# Patient Record
Sex: Male | Born: 1940 | Race: Black or African American | Hispanic: No | Marital: Married | Smoking: Former smoker
Health system: Southern US, Community
[De-identification: ages and names within clinical notes are randomized; demographics above are authoritative.]

## PROBLEM LIST (undated history)

## (undated) DIAGNOSIS — L309 Dermatitis, unspecified: Secondary | ICD-10-CM

## (undated) DIAGNOSIS — I255 Ischemic cardiomyopathy: Secondary | ICD-10-CM

## (undated) DIAGNOSIS — I2510 Atherosclerotic heart disease of native coronary artery without angina pectoris: Secondary | ICD-10-CM

## (undated) DIAGNOSIS — R739 Hyperglycemia, unspecified: Secondary | ICD-10-CM

## (undated) DIAGNOSIS — R42 Dizziness and giddiness: Secondary | ICD-10-CM

## (undated) DIAGNOSIS — I10 Essential (primary) hypertension: Secondary | ICD-10-CM

## (undated) DIAGNOSIS — C61 Malignant neoplasm of prostate: Secondary | ICD-10-CM

## (undated) DIAGNOSIS — D649 Anemia, unspecified: Secondary | ICD-10-CM

## (undated) DIAGNOSIS — E785 Hyperlipidemia, unspecified: Secondary | ICD-10-CM

## (undated) DIAGNOSIS — M1990 Unspecified osteoarthritis, unspecified site: Secondary | ICD-10-CM

## (undated) DIAGNOSIS — K219 Gastro-esophageal reflux disease without esophagitis: Secondary | ICD-10-CM

## (undated) DIAGNOSIS — I219 Acute myocardial infarction, unspecified: Secondary | ICD-10-CM

## (undated) HISTORY — DX: Anemia, unspecified: D64.9

## (undated) HISTORY — DX: Unspecified osteoarthritis, unspecified site: M19.90

## (undated) HISTORY — DX: Dermatitis, unspecified: L30.9

## (undated) HISTORY — DX: Dizziness and giddiness: R42

## (undated) HISTORY — PX: CARDIAC SURGERY: SHX584

## (undated) HISTORY — DX: Ischemic cardiomyopathy: I25.5

## (undated) HISTORY — DX: Hyperglycemia, unspecified: R73.9

## (undated) HISTORY — DX: Atherosclerotic heart disease of native coronary artery without angina pectoris: I25.10

## (undated) HISTORY — DX: Essential (primary) hypertension: I10

## (undated) HISTORY — DX: Hyperlipidemia, unspecified: E78.5

## (undated) HISTORY — DX: Gastro-esophageal reflux disease without esophagitis: K21.9

## (undated) HISTORY — DX: Acute myocardial infarction, unspecified: I21.9

## (undated) HISTORY — PX: CORONARY STENT PLACEMENT: SHX1402

---

## 2005-04-18 ENCOUNTER — Emergency Department: Payer: Self-pay | Admitting: Emergency Medicine

## 2005-04-19 ENCOUNTER — Other Ambulatory Visit: Payer: Self-pay

## 2008-03-25 ENCOUNTER — Ambulatory Visit: Payer: Self-pay | Admitting: Family Medicine

## 2008-03-25 ENCOUNTER — Emergency Department: Payer: Self-pay | Admitting: Emergency Medicine

## 2008-04-02 ENCOUNTER — Inpatient Hospital Stay: Payer: Self-pay | Admitting: Internal Medicine

## 2008-04-12 ENCOUNTER — Ambulatory Visit: Payer: Self-pay | Admitting: Family Medicine

## 2013-03-21 ENCOUNTER — Other Ambulatory Visit: Payer: Self-pay | Admitting: Family Medicine

## 2013-03-21 LAB — COMPREHENSIVE METABOLIC PANEL
ALBUMIN: 3.6 g/dL (ref 3.4–5.0)
ALT: 18 U/L (ref 12–78)
AST: 20 U/L (ref 15–37)
Alkaline Phosphatase: 83 U/L
Anion Gap: 3 — ABNORMAL LOW (ref 7–16)
BILIRUBIN TOTAL: 0.5 mg/dL (ref 0.2–1.0)
BUN: 13 mg/dL (ref 7–18)
CHLORIDE: 105 mmol/L (ref 98–107)
CO2: 29 mmol/L (ref 21–32)
Calcium, Total: 9.3 mg/dL (ref 8.5–10.1)
Creatinine: 0.98 mg/dL (ref 0.60–1.30)
EGFR (Non-African Amer.): >60
GLUCOSE: 93 mg/dL (ref 65–99)
Osmolality: 274 (ref 275–301)
Potassium: 3.6 mmol/L (ref 3.5–5.1)
Sodium: 137 mmol/L (ref 136–145)
TOTAL PROTEIN: 8.6 g/dL — AB (ref 6.4–8.2)

## 2013-03-21 LAB — CBC WITH DIFFERENTIAL/PLATELET
BASOS PCT: 0.9 %
Basophil #: 0.1 10*3/uL (ref 0.0–0.1)
EOS PCT: 5.3 %
Eosinophil #: 0.4 10*3/uL (ref 0.0–0.7)
HCT: 35.5 % — ABNORMAL LOW (ref 40.0–52.0)
HGB: 12 g/dL — ABNORMAL LOW (ref 13.0–18.0)
Lymphocyte #: 1.6 10*3/uL (ref 1.0–3.6)
Lymphocyte %: 20.1 %
MCH: 27.8 pg (ref 26.0–34.0)
MCHC: 33.8 g/dL (ref 32.0–36.0)
MCV: 82 fL (ref 80–100)
MONOS PCT: 9.9 %
Monocyte #: 0.8 x10 3/mm (ref 0.2–1.0)
NEUTROS ABS: 5.1 10*3/uL (ref 1.4–6.5)
NEUTROS PCT: 63.8 %
Platelet: 302 10*3/uL (ref 150–440)
RBC: 4.32 10*6/uL — AB (ref 4.40–5.90)
RDW: 16.2 % — ABNORMAL HIGH (ref 11.5–14.5)
WBC: 7.9 10*3/uL (ref 3.8–10.6)

## 2013-03-22 ENCOUNTER — Ambulatory Visit: Payer: Self-pay | Admitting: Family Medicine

## 2013-03-29 ENCOUNTER — Ambulatory Visit: Payer: Self-pay | Admitting: Family Medicine

## 2014-06-25 DIAGNOSIS — I2510 Atherosclerotic heart disease of native coronary artery without angina pectoris: Secondary | ICD-10-CM | POA: Insufficient documentation

## 2014-06-25 DIAGNOSIS — E782 Mixed hyperlipidemia: Secondary | ICD-10-CM | POA: Insufficient documentation

## 2014-06-25 DIAGNOSIS — I10 Essential (primary) hypertension: Secondary | ICD-10-CM

## 2014-06-25 DIAGNOSIS — R0681 Apnea, not elsewhere classified: Secondary | ICD-10-CM | POA: Insufficient documentation

## 2014-06-25 HISTORY — DX: Essential (primary) hypertension: I10

## 2014-06-25 HISTORY — DX: Atherosclerotic heart disease of native coronary artery without angina pectoris: I25.10

## 2014-07-23 DIAGNOSIS — I255 Ischemic cardiomyopathy: Secondary | ICD-10-CM | POA: Insufficient documentation

## 2014-07-23 HISTORY — DX: Ischemic cardiomyopathy: I25.5

## 2014-08-22 ENCOUNTER — Telehealth: Payer: Self-pay

## 2014-08-22 NOTE — Telephone Encounter (Signed)
-----   Message from Otilio Jefferson sent at 08/19/2014 11:04 AM EDT ----- Regarding: Re: triage From: [place] Sent: 08/05/2014  Pt going for a stress test May 10th. Would like a call back after that to schedule. GF  > From: U[place]ses [place] > To: Colonoscopy, Triage > Sent: 06/19/2014 2:02 PM > please contact for colonoscopy screening

## 2014-08-22 NOTE — Telephone Encounter (Signed)
Contacted pt to schedule colonoscopy. While going through medication list. Pt is currently taking Plavix and a daily ASA. Notified him he will need to schedule an appt with Vickey Huger. Pt scheduled for an appt on July 1st.

## 2014-09-10 ENCOUNTER — Telehealth: Payer: Self-pay

## 2014-09-10 ENCOUNTER — Other Ambulatory Visit: Payer: Self-pay

## 2014-09-10 NOTE — Telephone Encounter (Signed)
Upon getting charts ready for clinic, noticed patient is not currently taking Plavix per PCP. Called Dr. Alveria Apley office and spoke with Baker Janus. She verifies that patient has not been taking Plavix since 2010 and is only on 81mg  ASA at this time.  Called patient to cancel appointment. No answer and unable to leave VM as mail is full at this time. Will call once again at a later time.

## 2014-09-12 ENCOUNTER — Telehealth: Payer: Self-pay | Admitting: Urgent Care

## 2014-09-12 NOTE — Telephone Encounter (Signed)
Pt has called and was advised that a nurse will contact him within 2 weeks time to do a triage over the phone for a colon cancer screening. Pt understood.

## 2014-09-13 ENCOUNTER — Ambulatory Visit: Payer: Self-pay | Admitting: Urgent Care

## 2014-09-14 ENCOUNTER — Emergency Department
Admission: EM | Admit: 2014-09-14 | Discharge: 2014-09-14 | Disposition: A | Discharge status: Home / Self Care | Payer: Medicare PPO | Attending: Emergency Medicine | Admitting: Emergency Medicine

## 2014-09-14 ENCOUNTER — Emergency Department: Payer: Medicare PPO

## 2014-09-14 DIAGNOSIS — E785 Hyperlipidemia, unspecified: Secondary | ICD-10-CM | POA: Diagnosis not present

## 2014-09-14 DIAGNOSIS — Z87891 Personal history of nicotine dependence: Secondary | ICD-10-CM | POA: Insufficient documentation

## 2014-09-14 DIAGNOSIS — Z79899 Other long term (current) drug therapy: Secondary | ICD-10-CM | POA: Insufficient documentation

## 2014-09-14 DIAGNOSIS — M1711 Unilateral primary osteoarthritis, right knee: Secondary | ICD-10-CM | POA: Diagnosis not present

## 2014-09-14 DIAGNOSIS — I10 Essential (primary) hypertension: Secondary | ICD-10-CM | POA: Insufficient documentation

## 2014-09-14 DIAGNOSIS — E119 Type 2 diabetes mellitus without complications: Secondary | ICD-10-CM | POA: Diagnosis not present

## 2014-09-14 DIAGNOSIS — Z7982 Long term (current) use of aspirin: Secondary | ICD-10-CM | POA: Insufficient documentation

## 2014-09-14 DIAGNOSIS — M25561 Pain in right knee: Secondary | ICD-10-CM | POA: Diagnosis present

## 2014-09-14 MED ORDER — TRAMADOL HCL 50 MG PO TABS
50.0000 mg | ORAL_TABLET | Freq: Once | ORAL | Status: AC
  Administered 2014-09-14: 50 mg via ORAL

## 2014-09-14 MED ORDER — TRAMADOL HCL 50 MG PO TABS
ORAL_TABLET | ORAL | Status: AC
  Administered 2014-09-14: 50 mg via ORAL
  Filled 2014-09-14: qty 1

## 2014-09-14 MED ORDER — TRAMADOL HCL 50 MG PO TABS: 50.0000 mg | ORAL_TABLET | Freq: Four times a day (QID) | ORAL | Status: DC | PRN

## 2014-09-14 NOTE — Discharge Instructions (Signed)
Arthritis, Nonspecific °Arthritis is pain, redness, warmth, or puffiness (inflammation) of a joint. The joint may be stiff or hurt when you move it. One or more joints may be affected. There are many types of arthritis. Your doctor may not know what type you have right away. The most common cause of arthritis is wear and tear on the joint (osteoarthritis). °HOME CARE  °· Only take medicine as told by your doctor. °· Rest the joint as much as possible. °· Raise (elevate) your joint if it is puffy. °· Use crutches if the painful joint is in your leg. °· Drink enough fluids to keep your pee (urine) clear or pale yellow. °· Follow your doctor's diet instructions. °· Use cold packs for very bad joint pain for 10 to 15 minutes every hour. Ask your doctor if it is okay for you to use hot packs. °· Exercise as told by your doctor. °· Take a warm shower if you have stiffness in the morning. °· Move your sore joints throughout the day. °GET HELP RIGHT AWAY IF:  °· You have a fever. °· You have very bad joint pain, puffiness, or redness. °· You have many joints that are painful and puffy. °· You are not getting better with treatment. °· You have very bad back pain or leg weakness. °· You cannot control when you poop (bowel movement) or pee (urinate). °· You do not feel better in 24 hours or are getting worse. °· You are having side effects from your medicine. °MAKE SURE YOU:  °· Understand these instructions. °· Will watch your condition. °· Will get help right away if you are not doing well or get worse. °Document Released: 05/26/2009 Document Revised: 08/31/2011 Document Reviewed: 05/26/2009 °ExitCare® Patient Information ©2015 [employer]. This information is not intended to replace advice given to you by your health care provider. Make sure you discuss any questions you have with your health care provider. ° °

## 2014-09-14 NOTE — ED Notes (Signed)
NAD noted at time of D/C. Pt taken to the lobby via wheelchair at this time.  

## 2014-09-14 NOTE — ED Provider Notes (Signed)
[facility] Emergency Department Provider Note  ____________________________________________  Time seen: Approximately 2:32 PM  I have reviewed the triage vital signs and the nursing notes.   HISTORY  Chief Complaint Leg Pain    HPI Bradley Chaney is a 74 y.o. male complaining of right knee pain for 2-3 months. Patient say increase in the last 3 days. Patient denies any injury increased physical activity. Patient stated he also pain in the left knee but is not severe as the right knee. Patient is rating his pain as a 10 over 10. States the pain is increased with ambulation. Patient denies any loss of sensation or loss of strength to the lower extremity. No palliative measures taken since the pain worsened 3 days ago.   Past Medical History  Diagnosis Date  . Hyperglycemia   . Hypertension   . CAD (coronary artery disease)   . Hyperlipidemia   . Neck mass     Right side  . Hematochezia   . Anemia   . Dizziness   . Eczema   . Myocardial infarction     There are no active problems to display for this patient.   Past Surgical History  Procedure Laterality Date  . Coronary stent placement      RCA  . Cardiac surgery      MIX2, stents placed    Current Outpatient Rx  Name  Route  Sig  Dispense  Refill  . amLODipine (NORVASC) 10 MG tablet   Oral   Take 10 mg by mouth daily.         Marland Kitchen aspirin EC 81 MG tablet   Oral   Take 81 mg by mouth daily.         Marland Kitchen atorvastatin (LIPITOR) 40 MG tablet   Oral   Take 40 mg by mouth daily.         . hydrochlorothiazide (MICROZIDE) 12.5 MG capsule   Oral   Take 12.5 mg by mouth daily.         . mometasone (ELOCON) 0.1 % cream   Topical   Apply 1 application topically daily.         . traMADol (ULTRAM) 50 MG tablet   Oral   Take 1 tablet (50 mg total) by mouth every 6 (six) hours as needed.   20 tablet   0     Allergies Review of patient's allergies indicates no known  allergies.  Family History  Problem Relation Age of Onset  . Cancer Mother     Social History History  Substance Use Topics  . Smoking status: Former Smoker    Quit date: 09/10/1962  . Smokeless tobacco: Never Used  . Alcohol Use: No    Review of Systems Constitutional: No fever/chills Eyes: No visual changes. ENT: No sore throat. Cardiovascular: Denies chest pain. Respiratory: Denies shortness of breath. Gastrointestinal: No abdominal pain.  No nausea, no vomiting.  No diarrhea.  No constipation. Genitourinary: Negative for dysuria. Musculoskeletal: Right knee pain. Skin: Negative for rash. Neurological: Negative for headaches, focal weakness or numbness. Endocrine:Hypertension, hyperlipidemia and diabetes. 10-point ROS otherwise negative.  ____________________________________________   PHYSICAL EXAM:  VITAL SIGNS: ED Triage Vitals  Enc Vitals Group     BP 09/14/14 1304 158/121 mmHg     Pulse Rate 09/14/14 1304 107     Resp --      Temp 09/14/14 1304 98.9 F (37.2 C)     Temp Source 09/14/14 1304 Oral  SpO2 09/14/14 1304 99 %     Weight 09/14/14 1304 240 lb (108.863 kg)     Height 09/14/14 1304 5\' 10"  (1.778 m)     Head Cir --      Peak Flow --      Pain Score 09/14/14 1306 10     Pain Loc --      Pain Edu? --      Excl. in [place]? --     Constitutional: Alert and oriented. Well appearing and in no acute distress. Eyes: Conjunctivae are normal. PERRL. EOMI. Head: Atraumatic. Nose: No congestion/rhinnorhea. Mouth/Throat: Mucous membranes are moist.  Oropharynx non-erythematous. Neck: No stridor. No cervical spine tenderness to palpation Hematological/Lymphatic/Immunilogical: No cervical lymphadenopathy. Cardiovascular: Normal rate, regular rhythm. Grossly normal heart sounds.  Good peripheral circulation. Elevated BP will retake before patient's discharge. Respiratory: Normal respiratory effort.  No retractions. Lungs CTAB. Gastrointestinal: Soft and  nontender. No distention. No abdominal bruits. No CVA tenderness. Musculoskeletal: No obvious deformity to the lower extremities. Mild edema bilaterally. Anterior patella is tender palpation. Marked crepitus. Neurologic:  Normal speech and language. No gross focal neurologic deficits are appreciated. Speech is normal. No gait instability. Skin:  Skin is warm, dry and intact. No rash noted. Psychiatric: Mood and affect are normal. Speech and behavior are normal.  ____________________________________________   LABS (all labs ordered are listed, but only abnormal results are displayed)  Labs Reviewed - No data to display ____________________________________________  EKG   ____________________________________________  RADIOLOGY  Severe  arthritis of the medial compartment of the right knee. I, Sable Feil, personally viewed and evaluated these images as part of my medical decision making.   ____________________________________________   PROCEDURES  Procedure(s) performed: None  Critical Care performed: No  ____________________________________________   INITIAL IMPRESSION / ASSESSMENT AND PLAN / ED COURSE  Pertinent labs & imaging results that were available during my care of the patient were reviewed by me and considered in my medical decision making (see chart for details).  Severe arthritis right knee. Patient advised to follow with his family doctor for continued care at this diagnosis. Patient advised determined EF his condition worsens. Patient will be given tramadol for 3 days.Dicharge BP 144/67.  Further evaluation and treatment plans family doctor. FINAL CLINICAL IMPRESSION(S) / ED DIAGNOSES  Final diagnoses:  Primary osteoarthritis of right knee      Sable Feil, PA-C 09/14/14 [place], PA-C 09/14/14 1549  Lisa Roca, MD 09/21/14 2043

## 2014-09-14 NOTE — ED Notes (Signed)
Pt reporting right leg pain, denies any injury or falls.

## 2014-09-18 NOTE — Telephone Encounter (Signed)
LVM for pt to return my call.

## 2014-09-26 NOTE — Telephone Encounter (Signed)
Tried contacting pt to schedule colonoscopy. VM is full. Unable to leave message. Mailed letter.

## 2014-10-14 DIAGNOSIS — C61 Malignant neoplasm of prostate: Secondary | ICD-10-CM

## 2014-10-14 HISTORY — DX: Malignant neoplasm of prostate: C61

## 2014-10-17 DIAGNOSIS — D649 Anemia, unspecified: Secondary | ICD-10-CM | POA: Insufficient documentation

## 2014-10-17 DIAGNOSIS — L309 Dermatitis, unspecified: Secondary | ICD-10-CM | POA: Insufficient documentation

## 2014-10-17 DIAGNOSIS — R739 Hyperglycemia, unspecified: Secondary | ICD-10-CM | POA: Insufficient documentation

## 2014-10-17 HISTORY — DX: Dermatitis, unspecified: L30.9

## 2014-10-18 ENCOUNTER — Ambulatory Visit (INDEPENDENT_AMBULATORY_CARE_PROVIDER_SITE_OTHER): Payer: Medicare PPO | Admitting: Family Medicine

## 2014-10-18 VITALS — BP 116/68 | HR 64 | Temp 97.9°F | Resp 16 | Wt 234.0 lb

## 2014-10-18 DIAGNOSIS — M158 Other polyosteoarthritis: Secondary | ICD-10-CM

## 2014-10-18 DIAGNOSIS — I10 Essential (primary) hypertension: Secondary | ICD-10-CM

## 2014-10-18 DIAGNOSIS — M25562 Pain in left knee: Secondary | ICD-10-CM | POA: Diagnosis not present

## 2014-10-18 DIAGNOSIS — M1990 Unspecified osteoarthritis, unspecified site: Secondary | ICD-10-CM

## 2014-10-18 DIAGNOSIS — M25569 Pain in unspecified knee: Secondary | ICD-10-CM | POA: Insufficient documentation

## 2014-10-18 DIAGNOSIS — R972 Elevated prostate specific antigen [PSA]: Secondary | ICD-10-CM

## 2014-10-18 DIAGNOSIS — M25561 Pain in right knee: Secondary | ICD-10-CM | POA: Diagnosis not present

## 2014-10-18 DIAGNOSIS — Z125 Encounter for screening for malignant neoplasm of prostate: Secondary | ICD-10-CM

## 2014-10-18 DIAGNOSIS — D508 Other iron deficiency anemias: Secondary | ICD-10-CM

## 2014-10-18 HISTORY — DX: Unspecified osteoarthritis, unspecified site: M19.90

## 2014-10-18 MED ORDER — METOPROLOL SUCCINATE ER 25 MG PO TB24: 25.0000 mg | ORAL_TABLET | Freq: Every day | ORAL | Status: DC

## 2014-10-18 MED ORDER — TRAMADOL HCL 50 MG PO TABS: 50.0000 mg | ORAL_TABLET | Freq: Four times a day (QID) | ORAL | Status: DC | PRN

## 2014-10-18 MED ORDER — AMLODIPINE BESYLATE 10 MG PO TABS: 10.0000 mg | ORAL_TABLET | Freq: Every day | ORAL | Status: DC

## 2014-10-18 NOTE — Progress Notes (Signed)
Patient ID: Bradley Chaney, male   DOB: 1940-05-13, 74 y.o.   MRN: [id number]    Subjective:  HPI Pt is here to follow up from a ER visit on 09/14/14. He went to the ER for b/l knee pain. They did Xrays and told him it was OA. He was given Tramadol. He reports that he is still having the pain. The right knee is worse than the left one. HE has been taking the Tramadol and it is helping with the pain. He was told in the ER to follow up with his PCP. Does take as needed. Really did help.   He reports that prior to the ER visit he had knee pain for a while but the day he went it was much worse and he could hardly walk, which is why he went to the ER.   Does feel that Ultram enables him to function. Not ready for specialist yer.   Prior to Admission medications   Medication Sig Start Date End Date Taking? Authorizing Provider  amLODipine (NORVASC) 10 MG tablet Take 10 mg by mouth daily.   Yes Historical Provider, MD  aspirin EC 81 MG tablet Take 81 mg by mouth daily.   Yes Historical Provider, MD  atorvastatin (LIPITOR) 40 MG tablet Take 40 mg by mouth daily.   Yes Historical Provider, MD  clopidogrel (PLAVIX) 75 MG tablet Take by mouth. 07/05/14  Yes Historical Provider, MD  hydrochlorothiazide (MICROZIDE) 12.5 MG capsule Take 12.5 mg by mouth daily.   Yes Historical Provider, MD  lisinopril (PRINIVIL,ZESTRIL) 10 MG tablet Take by mouth. 06/18/14  Yes Historical Provider, MD  metoprolol succinate (TOPROL-XL) 25 MG 24 hr tablet Take by mouth. 06/18/14  Yes Historical Provider, MD  traMADol (ULTRAM) 50 MG tablet Take 1 tablet (50 mg total) by mouth every 6 (six) hours as needed. 09/14/14 09/14/15 Yes Sable Feil, PA-C  mometasone (ELOCON) 0.1 % cream Apply 1 application topically daily.    Historical Provider, MD    Patient Active Problem List   Diagnosis Date Noted  . Absolute anemia 10/17/2014  . Dermatitis, eczematoid 10/17/2014  . Blood glucose elevated 10/17/2014  . Cardiomyopathy, ischemic  07/23/2014  . Benign essential HTN 06/25/2014  . 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 Arteriosclerosis of coronary artery 06/25/2014    Past Medical History  Diagnosis Date  . Hyperglycemia   . Hypertension   . CAD (coronary artery disease)   . Hyperlipidemia   . Neck mass     Right side  . Hematochezia   . Anemia   . Dizziness   . Eczema   . Myocardial infarction     History   Social History  . Marital Status: Widowed    Spouse Name: N/A  . Number of Children: 4  . Years of Education: College   Occupational History  . Retired    Social History Main Topics  . Smoking status: Former Smoker -- 3 years    Types: Cigarettes    Quit date: 09/10/1962  . Smokeless tobacco: Never Used  . Alcohol Use: No  . Drug Use: No  . Sexual Activity: Not on file   Other Topics Concern  . Not on file   Social History Narrative    No Known Allergies  Review of Systems  Constitutional: Negative.   HENT: Negative.   Eyes: Negative.   Respiratory: Negative.   Cardiovascular: Negative.   Gastrointestinal: Negative.   Genitourinary: Negative.   Musculoskeletal: Positive  for joint pain.  Skin: Negative.   Neurological: Negative.   Endo/Heme/Allergies: Negative.   Psychiatric/Behavioral: Negative.     Immunization History  Administered Date(s) Administered  . Pneumococcal Conjugate-13 06/18/2014   Objective:  BP 116/68 mmHg  Pulse 64  Temp(Src) 97.9 F (36.6 C) (Oral)  Resp 16  Wt 234 lb (106.142 kg)  Physical Exam  Constitutional: He is oriented to person, place, and time and well-developed, well-nourished, and in no distress.  Cardiovascular: Normal rate and regular rhythm.   Pulmonary/Chest: Effort normal and breath sounds normal.  Neurological: He is alert and oriented to person, place, and time.  Psychiatric: Mood, memory, affect and judgment normal.     Assessment and Plan :  1. Arthralgia of both knees Improved with Ultram.     2. Other osteoarthritis involving multiple joints Will refill medication. Patient instructed to call back if condition worsens or does not improve and will refer to orthopedics.  - traMADol (ULTRAM) 50 MG tablet; Take 1 tablet (50 mg total) by mouth every 6 (six) hours as needed.  Dispense: 120 tablet; Refill: 3  3. Other iron deficiency anemias Still has not followed up for colonoscopy as instructed.  Will recheck labs and call for refer.  - CBC with Differential/Platelet - Comprehensive metabolic panel - Ferritin - Iron and TIBC  4. Benign essential HTN Condition is stable. Please continue current medication and  plan of care as noted.   - amLODipine (NORVASC) 10 MG tablet; Take 1 tablet (10 mg total) by mouth daily.  Dispense: 90 tablet; Refill: 3 - metoprolol succinate (TOPROL-XL) 25 MG 24 hr tablet; Take 1 tablet (25 mg total) by mouth daily.  Dispense: 90 tablet; Refill: 3  5. Screening PSA (prostate specific antigen) Will check lab.   - Pen Mar, MD  [facility] 10/18/2014 11:11 AM

## 2014-10-19 LAB — CBC WITH DIFFERENTIAL/PLATELET
BASOS ABS: 0 10*3/uL (ref 0.0–0.2)
Basos: 1 %
EOS (ABSOLUTE): 0.3 10*3/uL (ref 0.0–0.4)
EOS: 6 %
HEMOGLOBIN: 11.1 g/dL — AB (ref 12.6–17.7)
Hematocrit: 32.6 % — ABNORMAL LOW (ref 37.5–51.0)
IMMATURE GRANULOCYTES: 0 %
Immature Grans (Abs): 0 10*3/uL (ref 0.0–0.1)
LYMPHS ABS: 2 10*3/uL (ref 0.7–3.1)
LYMPHS: 34 %
MCH: 27 pg (ref 26.6–33.0)
MCHC: 34 g/dL (ref 31.5–35.7)
MCV: 79 fL (ref 79–97)
Monocytes Absolute: 0.7 10*3/uL (ref 0.1–0.9)
Monocytes: 11 %
NEUTROS ABS: 2.9 10*3/uL (ref 1.4–7.0)
NEUTROS PCT: 48 %
Platelets: 317 10*3/uL (ref 150–379)
RBC: 4.11 x10E6/uL — ABNORMAL LOW (ref 4.14–5.80)
RDW: 16.8 % — AB (ref 12.3–15.4)
WBC: 5.9 10*3/uL (ref 3.4–10.8)

## 2014-10-19 LAB — COMPREHENSIVE METABOLIC PANEL
ALBUMIN: 4.2 g/dL (ref 3.5–4.8)
ALT: 6 IU/L (ref 0–44)
AST: 11 IU/L (ref 0–40)
Albumin/Globulin Ratio: 1.2 (ref 1.1–2.5)
Alkaline Phosphatase: 73 IU/L (ref 39–117)
BUN / CREAT RATIO: 14 (ref 10–22)
BUN: 20 mg/dL (ref 8–27)
Bilirubin Total: 0.5 mg/dL (ref 0.0–1.2)
CO2: 27 mmol/L (ref 18–29)
Calcium: 9.5 mg/dL (ref 8.6–10.2)
Chloride: 98 mmol/L (ref 97–108)
Creatinine, Ser: 1.38 mg/dL — ABNORMAL HIGH (ref 0.76–1.27)
GFR calc Af Amer: 58 mL/min/{1.73_m2} — ABNORMAL LOW (ref >59)
GFR calc non Af Amer: 50 mL/min/{1.73_m2} — ABNORMAL LOW (ref >59)
GLUCOSE: 110 mg/dL — AB (ref 65–99)
Globulin, Total: 3.4 g/dL (ref 1.5–4.5)
Potassium: 3.9 mmol/L (ref 3.5–5.2)
Sodium: 140 mmol/L (ref 134–144)
Total Protein: 7.6 g/dL (ref 6.0–8.5)

## 2014-10-19 LAB — IRON AND TIBC
Iron Saturation: 17 % (ref 15–55)
Iron: 45 ug/dL (ref 38–169)
TIBC: 262 ug/dL (ref 250–450)
UIBC: 217 ug/dL (ref 111–343)

## 2014-10-19 LAB — FERRITIN: Ferritin: 320 ng/mL (ref 30–400)

## 2014-10-19 LAB — PSA: Prostate Specific Ag, Serum: 99.6 ng/mL — ABNORMAL HIGH (ref 0.0–4.0)

## 2014-10-20 ENCOUNTER — Encounter: Payer: Self-pay | Admitting: Family Medicine

## 2014-10-21 NOTE — Addendum Note (Signed)
Addended by: Jerrell Belfast on: 10/21/2014 02:37 PM   Modules accepted: Orders

## 2014-10-22 ENCOUNTER — Ambulatory Visit (INDEPENDENT_AMBULATORY_CARE_PROVIDER_SITE_OTHER): Payer: Medicare PPO | Admitting: Urology

## 2014-10-22 VITALS — BP 175/89 | HR 86 | Ht 70.5 in | Wt 234.1 lb

## 2014-10-22 DIAGNOSIS — R972 Elevated prostate specific antigen [PSA]: Secondary | ICD-10-CM

## 2014-10-22 NOTE — Progress Notes (Signed)
10/22/2014 5:43 AM   Bradley Chaney 1940/07/09 [id number]  Referring provider: Margarita Rana, MD [address] [place], [place] [address]  No chief complaint on file.   HPI: Patient was found to have an elevated PSA which was drawn as part of a prostate cancer screening.  He has no family history of prostate cancer.  The patient seen recently for bilateral knee pain (attributed to Osteo-arthiritis), denies any other bone pain, new back pain, or lower extremity edema.  The patient denies any changes in his voiding symptoms over the last 6 months.  Specifically he denies dysuria or hematuria.  PSA History: PROSTATE SPECIFIC AG, SERUM  Date/Time Value Ref Range Status  10/18/2014 12:26 PM 99.6* 0.0 - 4.0 ng/mL Final    Comment:    Roche ECLIA methodology. According to the American Urological Association, Serum PSA should decrease and remain at undetectable levels after radical prostatectomy. The AUA defines biochemical recurrence as an initial PSA value 0.2 ng/mL or greater followed by a subsequent confirmatory PSA value 0.2 ng/mL or greater. Values obtained with different assay methods or kits cannot be used interchangeably. Results cannot be interpreted as absolute evidence of the presence or absence of malignant disease.     IPSS: 10, QoL6 SHIM:  Patient is unable to achieve adequate erections, he is not sexually active at this time.      PMH: Past Medical History  Diagnosis Date  . Hyperglycemia   . Hypertension   . CAD (coronary artery disease)   . Hyperlipidemia   . Neck mass     Right side  . Hematochezia   . Anemia   . Dizziness   . Eczema   . Myocardial infarction     Surgical History: Past Surgical History  Procedure Laterality Date  . Coronary stent placement      RCA  . Cardiac surgery      MIX2, stents placed    Home Medications:    Medication List       This list is accurate as of: 10/22/14  5:43 AM.  Always use your most recent  med list.               amLODipine 10 MG tablet  Commonly known as:  NORVASC  Take 1 tablet (10 mg total) by mouth daily.     aspirin EC 81 MG tablet  Take 81 mg by mouth daily.     atorvastatin 40 MG tablet  Commonly known as:  LIPITOR  Take 40 mg by mouth daily.     hydrochlorothiazide 12.5 MG capsule  Commonly known as:  MICROZIDE  Take 12.5 mg by mouth daily.     lisinopril 10 MG tablet  Commonly known as:  PRINIVIL,ZESTRIL  Take by mouth.     metoprolol succinate 25 MG 24 hr tablet  Commonly known as:  TOPROL-XL  Take 1 tablet (25 mg total) by mouth daily.     mometasone 0.1 % cream  Commonly known as:  ELOCON  Apply 1 application topically daily.     PLAVIX 75 MG tablet  Generic drug:  clopidogrel  Take by mouth.     traMADol 50 MG tablet  Commonly known as:  ULTRAM  Take 1 tablet (50 mg total) by mouth every 6 (six) hours as needed.        Allergies: No Known Allergies  Family History: Family History  Problem Relation Age of Onset  . Cancer Mother     Social  History:  reports that he quit smoking about 52 years ago. His smoking use included Cigarettes. He quit after 3 years of use. He has never used smokeless tobacco. He reports that he does not drink alcohol or use illicit drugs.  ROS: 12-point review of system was performed and is negative unless otherwise stated in the HPI with the following additions.  Physical Exam: There were no vitals taken for this visit.  Constitutional:  Alert and oriented, No acute distress. HEENT: [place] AT, moist mucus membranes.  Trachea midline, no masses. Cardiovascular: No clubbing, cyanosis, or edema. Respiratory: Normal respiratory effort, no increased work of breathing. GI: Abdomen is soft, nontender, nondistended, no abdominal masses GU:  Rectal exam reveals a firm prostate proximal May 40 g in size, the entire prostate appeared to be nodular. There was no apparent extraprostatic extension posteriorly. Skin: No  rashes, bruises or suspicious lesions. Lymph: No cervical or inguinal adenopathy. Neurologic: Grossly intact, no focal deficits, moving all 4 extremities. Psychiatric: Normal mood and affect.  Laboratory Data: Lab Results  Component Value Date   WBC 5.9 10/18/2014   HGB 12.0* 03/21/2013   HCT 32.6* 10/18/2014   MCV 82 03/21/2013   PLT 302 03/21/2013    Lab Results  Component Value Date   CREATININE 1.38* 10/18/2014    Lab Results  Component Value Date   PSA 99.6* 10/18/2014    No results found for: TESTOSTERONE  No results found for: HGBA1C  Urinalysis No results found for: COLORURINE, APPEARANCEUR, LABSPEC, PHURINE, GLUCOSEU, HGBUR, BILIRUBINUR, KETONESUR, PROTEINUR, UROBILINOGEN, NITRITE, LEUKOCYTESUR  Pertinent Imaging: Right knee demonstrates osteo-arthritic changes.   Assessment & Plan:  Elevated PSA   The patient's rectal exam as well as his elevated PSA R certainly concerning for prostate cancer. I expressed my concern to the patient and recommended that we proceed with biopsy. I went over the biopsy information with the patient in great detail. We'll get this scheduled as soon as possible. However, the patient will need to stop his Plavix 5 days prior to the procedure. We'll get permission to do this from his primary care provider. Fortunately, the patient is asymptomatic at this point.    No Follow-up on file.  Ardis Hughs, [facility] [address] [place], [place] [address] [phone]

## 2014-11-05 ENCOUNTER — Ambulatory Visit (INDEPENDENT_AMBULATORY_CARE_PROVIDER_SITE_OTHER): Payer: Medicare PPO | Admitting: Urology

## 2014-11-05 ENCOUNTER — Other Ambulatory Visit: Payer: Self-pay | Admitting: Urology

## 2014-11-05 VITALS — BP 145/82 | HR 76 | Ht 70.0 in | Wt 227.0 lb

## 2014-11-05 DIAGNOSIS — R972 Elevated prostate specific antigen [PSA]: Secondary | ICD-10-CM

## 2014-11-05 MED ORDER — LEVOFLOXACIN 500 MG PO TABS
500.0000 mg | ORAL_TABLET | Freq: Once | ORAL | Status: AC
  Administered 2014-11-05: 500 mg via ORAL

## 2014-11-05 MED ORDER — GENTAMICIN SULFATE 40 MG/ML IJ SOLN
80.0000 mg | Freq: Once | INTRAMUSCULAR | Status: AC
  Administered 2014-11-05: 80 mg via INTRAMUSCULAR

## 2014-11-05 NOTE — Progress Notes (Signed)
Prostate Biopsy Procedure   Informed consent was obtained after discussing risks/benefits of the procedure.  A time out was performed to ensure correct patient identity.  Pre-Procedure: - Last PSA Level: 99.6 Lab Results  Component Value Date   PSA 99.6* 10/18/2014   - Gentamicin given prophylactically - Levaquin 500 mg administered PO -Transrectal Ultrasound performed revealing a 30.4 gm prostate -Several significant hyperechoic areas are noted in the posterior and apical areas of the prostate. The seminal vesicles were enlarged  Procedure: - Prostate block performed using 10 cc 1% lidocaine and biopsies taken from sextant areas, a total of 12 under ultrasound guidance.  Post-Procedure: - Patient tolerated the procedure well - He was counseled to seek immediate medical attention if experiences any severe pain, significant bleeding, or fevers - Return in one week to discuss biopsy results

## 2014-11-08 LAB — PATHOLOGY REPORT

## 2014-11-11 ENCOUNTER — Ambulatory Visit (INDEPENDENT_AMBULATORY_CARE_PROVIDER_SITE_OTHER): Payer: Medicare PPO | Admitting: Obstetrics and Gynecology

## 2014-11-11 ENCOUNTER — Encounter: Payer: Self-pay | Admitting: Obstetrics and Gynecology

## 2014-11-11 VITALS — BP 153/76 | HR 75 | Ht 70.0 in | Wt 230.0 lb

## 2014-11-11 DIAGNOSIS — C61 Malignant neoplasm of prostate: Secondary | ICD-10-CM

## 2014-11-11 MED ORDER — DEGARELIX ACETATE 120 MG ~~LOC~~ SOLR
240.0000 mg | Freq: Once | SUBCUTANEOUS | Status: AC
  Administered 2014-11-11: 240 mg via SUBCUTANEOUS

## 2014-11-11 NOTE — Progress Notes (Signed)
11/11/2014 8:45 PM   Bradley Chaney 05/23/1940 [id number]  Referring provider: Margarita Rana, MD [address] [place], [place] [address]  Chief Complaint  Patient presents with  . Results    Prostat Biopsy results  . Prostate Cancer    HPI: Patient is a 74 year old African-American male who initially presented to our office with an elevated PSA of 99 on 10-18-14. Patient has denied any acute urinary changes.  He does report bilateral knee pain but states this has been associated with osteoarthritis. Does report a weak urinary stream. No family history of prostate cancer. He was found to have a diffusely nodular prostate on exam and a prostate biopsy was recommended for further evaluation. Patient underwent TRUS biopsy of the prostate on 11-05-14. He is a widower and not currently sexually active. He presents today to discuss biopsy results.    Prostate biopsy performed on 8-20 3-16. Prostate: 30.4gm. iPSA 99. Pathology: Gleason 7 3+4 on 5/12 cores,  4+4 on 4/12 cores up to 94% of one core, Gleason 7 4+3 2/12 cores,Gleason 8 3+5 on 1/12.    PMH: Past Medical History  Diagnosis Date  . Hyperglycemia   . Hypertension   . CAD (coronary artery disease)   . Hyperlipidemia   . Neck mass     Right side  . Hematochezia   . Anemia   . Dizziness   . Eczema   . Myocardial infarction   . Acid reflux   . Arthritis   . Heart attack     Surgical History: Past Surgical History  Procedure Laterality Date  . Coronary stent placement      RCA  . Cardiac surgery      MIX2, stents placed    Home Medications:    Medication List       This list is accurate as of: 11/11/14  8:45 PM.  Always use your most recent med list.               amLODipine 10 MG tablet  Commonly known as:  NORVASC  Take 1 tablet (10 mg total) by mouth daily.     aspirin EC 81 MG tablet  Take 81 mg by mouth daily.     atorvastatin 40 MG tablet  Commonly known as:  LIPITOR  Take 40 mg by  mouth daily.     hydrochlorothiazide 12.5 MG capsule  Commonly known as:  MICROZIDE  Take 12.5 mg by mouth daily.     lisinopril 10 MG tablet  Commonly known as:  PRINIVIL,ZESTRIL  Take by mouth.     metoprolol succinate 25 MG 24 hr tablet  Commonly known as:  TOPROL-XL  Take 1 tablet (25 mg total) by mouth daily.     mometasone 0.1 % cream  Commonly known as:  ELOCON  Apply 1 application topically daily.     PLAVIX 75 MG tablet  Generic drug:  clopidogrel  Take by mouth.     traMADol 50 MG tablet  Commonly known as:  ULTRAM  Take 1 tablet (50 mg total) by mouth every 6 (six) hours as needed.        Allergies: No Known Allergies  Family History: Family History  Problem Relation Age of Onset  . Cancer Mother   . Kidney disease Neg Hx   . Prostate cancer Neg Hx     Social History:  reports that he quit smoking about 52 years ago. His smoking use included Cigarettes. He quit after 3  years of use. He has never used smokeless tobacco. He reports that he does not drink alcohol or use illicit drugs.  ROS:   Review of systems: A 12 point comprehensive review of systems was obtained and is negative unless otherwise stated in the history of present illness.  Physical Exam: BP 153/76 mmHg  Pulse 75  Ht 5\' 10"  (1.778 m)  Wt 230 lb (104.327 kg)  BMI 33.00 kg/m2  Constitutional:  Alert and oriented, No acute distress. HEENT: [place] AT, moist mucus membranes.  Trachea midline, no masses. Cardiovascular: No clubbing, cyanosis, or edema. Respiratory: Normal respiratory effort, no increased work of breathing. GI: Abdomen is soft, nontender, nondistended, midline abdominal hernia soft and nontender Skin: No rashes, bruises or suspicious lesions. Lymph: No cervical lymphadenopathy Neurologic: Grossly intact, no focal deficits, moving all 4 extremities. Psychiatric: Normal mood and affect.  Laboratory Data: Lab Results  Component Value Date   WBC 5.9 10/18/2014   HGB 12.0*  03/21/2013   HCT 32.6* 10/18/2014   MCV 82 03/21/2013   PLT 302 03/21/2013    Lab Results  Component Value Date   CREATININE 1.38* 10/18/2014    Lab Results  Component Value Date   PSA 99.6* 10/18/2014    No results found for: TESTOSTERONE   Assessment & Plan:   #1. Prostate cancer-  74yo AA male with newly diagnosed prostate cancer. Prostate biopsy performed on 8-20 3-16. Prostate: 30.4gm. iPSA 99. Pathology: Gleason 7 3+4 on 5/12 cores,  4+4 on 4/12 cores up to 94% of one core, Gleason 7 4+3 2/12 cores,Gleason 8 3+5 on 1/12.    The patient was counseled about the natural history of prostate cancer and the standard treatment options that are available for prostate cancer. It was explained to him how his age and life expectancy, clinical stage, Gleason score, and PSA affect his prognosis, the decision to proceed with additional staging studies, as well as how that information influences recommended treatment strategies. The patient was encouraged to ask questions throughout the discussion today and all questions were answered to his stated satisfaction. In addition, the patient was provided with and/or directed to appropriate resources and literature for further education about prostate cancer treatment options.  Androgen deprivation therapy and its indications discussed with patient in detail. Potential side effects reviewed including decreased libido, osteoporosis, increased risk for cardiovascular disease, vasomotor symptoms, decreased muscle mass, and fatigue. We will obtain a baseline bone density scan. We discussed the importance of bone health on ADT.  Recommendations 1000-1200 mg daily calcium supplement and [phone] IU vit D daily.  Also encouraged weight being exercises and cardiovascular health.  Patient understands risks and would like to begin therapy today.  Additional imaging including bone scan and CT ordered today as recommended by Dr. Elnoria Howard. Patient will follow up in 4  weeks for second injection and/or sooner for imaging results and to discuss further treatment with Dr. Elnoria Howard.  Referral made to [facility] for consultation.  Patient navigator made aware of patient's diagnosis and will be following his care.  I spent 30 min with this patient of which greater than 50% was spent in counseling and coordination of care with the patient.   Problem List Items Addressed This Visit    None    Visit Diagnoses    Prostate cancer    -  Primary    Relevant Medications    degarelix (FIRMAGON) injection 240 mg (Completed)    Other Relevant Orders    CT Pelvis W  Contrast    CT Pelvis Wo Contrast    Bone Density    NM Bone Scan Whole Body    Chest 2 View    Ambulatory referral to Oncology       Return in about 4 weeks (around 12/09/2014).  Herbert Moors, [facility] [address] [place], [place] [address] [phone]

## 2014-11-11 NOTE — Progress Notes (Signed)
Firmagon Sub Q Injection  Due to Prostate Cancer patient is present today for a Firmagon Injection.   Medication: Mills Koller (Degarelix)  Dose: 240mg  Location: right and left upper abdomen Lot: [id number] Exp: 12/2016  Patient tolerated well, no complications were noted  Performed by: Toniann Fail, LPN  Follow up: Pt made 43mo f/u for next injection at check out.

## 2014-11-12 ENCOUNTER — Ambulatory Visit: Payer: Medicare PPO

## 2014-11-19 ENCOUNTER — Other Ambulatory Visit: Payer: Self-pay | Admitting: Obstetrics and Gynecology

## 2014-11-19 ENCOUNTER — Telehealth: Payer: Self-pay | Admitting: Family Medicine

## 2014-11-19 ENCOUNTER — Other Ambulatory Visit: Payer: Self-pay | Admitting: Family Medicine

## 2014-11-19 DIAGNOSIS — I2510 Atherosclerotic heart disease of native coronary artery without angina pectoris: Secondary | ICD-10-CM

## 2014-11-19 DIAGNOSIS — C61 Malignant neoplasm of prostate: Secondary | ICD-10-CM | POA: Insufficient documentation

## 2014-11-19 NOTE — Telephone Encounter (Signed)
[name] from [facility] called stating that Bradley Chaney was seen by Herbert Moors and she was requesting that his PCP schedule a BMD for the patient. If you have any questions please call [phone] and speak with [facility].

## 2014-11-19 NOTE — Telephone Encounter (Signed)
Please put in Order for BMD for prostate cancer and send to [place]. Thank.

## 2014-11-19 NOTE — Telephone Encounter (Signed)
Order added.    Thanks,   -Mickel Baas

## 2014-11-20 ENCOUNTER — Other Ambulatory Visit: Payer: Self-pay | Admitting: Urology

## 2014-11-20 ENCOUNTER — Ambulatory Visit
Admission: RE | Admit: 2014-11-20 | Discharge: 2014-11-20 | Disposition: A | Discharge status: Home / Self Care | Payer: Medicare PPO | Source: Ambulatory Visit | Attending: Obstetrics and Gynecology | Admitting: Obstetrics and Gynecology

## 2014-11-20 DIAGNOSIS — C61 Malignant neoplasm of prostate: Secondary | ICD-10-CM | POA: Diagnosis present

## 2014-11-20 HISTORY — DX: Malignant neoplasm of prostate: C61

## 2014-11-20 MED ORDER — IOHEXOL 300 MG/ML  SOLN
100.0000 mL | Freq: Once | INTRAMUSCULAR | Status: AC | PRN
  Administered 2014-11-20: 100 mL via INTRAVENOUS

## 2014-11-22 ENCOUNTER — Ambulatory Visit: Payer: Medicare PPO

## 2014-11-28 ENCOUNTER — Ambulatory Visit
Admission: RE | Admit: 2014-11-28 | Discharge: 2014-11-28 | Disposition: A | Discharge status: Home / Self Care | Payer: Medicare PPO | Source: Ambulatory Visit | Attending: Family Medicine | Admitting: Family Medicine

## 2014-11-28 DIAGNOSIS — M85862 Other specified disorders of bone density and structure, left lower leg: Secondary | ICD-10-CM | POA: Insufficient documentation

## 2014-11-28 DIAGNOSIS — C61 Malignant neoplasm of prostate: Secondary | ICD-10-CM | POA: Diagnosis not present

## 2014-11-29 ENCOUNTER — Telehealth: Payer: Self-pay | Admitting: Family Medicine

## 2014-11-29 NOTE — Telephone Encounter (Signed)
Advised patient of bone density results.

## 2014-11-29 NOTE — Telephone Encounter (Signed)
Pt stated he was returning a call but the person didn't leave a name. Thanks TNP

## 2014-12-06 ENCOUNTER — Telehealth: Payer: Self-pay | Admitting: Radiology

## 2014-12-06 NOTE — Telephone Encounter (Signed)
Pt advised to call [phone] to r/s bone scan prior to appt with Dr Elnoria Howard on 12/17/14. Pt verbalizes understanding.

## 2014-12-11 ENCOUNTER — Encounter
Admission: RE | Admit: 2014-12-11 | Discharge: 2014-12-11 | Disposition: A | Discharge status: Home / Self Care | Payer: Medicare PPO | Source: Ambulatory Visit | Attending: Obstetrics and Gynecology | Admitting: Obstetrics and Gynecology

## 2014-12-11 DIAGNOSIS — C61 Malignant neoplasm of prostate: Secondary | ICD-10-CM | POA: Insufficient documentation

## 2014-12-11 MED ORDER — TECHNETIUM TC 99M MEDRONATE IV KIT
23.7000 | PACK | Freq: Once | INTRAVENOUS | Status: AC | PRN
  Administered 2014-12-11: 23.7 via INTRAVENOUS

## 2014-12-17 ENCOUNTER — Ambulatory Visit: Payer: Medicare PPO | Admitting: Urology

## 2014-12-17 ENCOUNTER — Encounter: Payer: Self-pay | Admitting: Urology

## 2014-12-17 NOTE — Progress Notes (Unsigned)
12/17/2014 11:20 AM   Bradley Chaney 07-08-1940 [id number]  Referring provider: Margarita Rana, MD [address] [place], [place] [address]  No chief complaint on file.   HPI: ***     PMH: Past Medical History  Diagnosis Date  . Hyperglycemia   . Hypertension   . CAD (coronary artery disease)   . Hyperlipidemia   . Neck mass     Right side  . Hematochezia   . Anemia   . Dizziness   . Eczema   . Myocardial infarction   . Acid reflux   . Arthritis   . Heart attack   . Prostate cancer 10/2014    Surgical History: Past Surgical History  Procedure Laterality Date  . Coronary stent placement      RCA  . Cardiac surgery      MIX2, stents placed    Home Medications:    Medication List       This list is accurate as of: 12/17/14 11:20 AM.  Always use your most recent med list.               amLODipine 10 MG tablet  Commonly known as:  NORVASC  Take 1 tablet (10 mg total) by mouth daily.     aspirin EC 81 MG tablet  Take 81 mg by mouth daily.     atorvastatin 40 MG tablet  Commonly known as:  LIPITOR  1 TABLET TABLET, ORAL, DAILY     hydrochlorothiazide 12.5 MG capsule  Commonly known as:  MICROZIDE  Take 12.5 mg by mouth daily.     lisinopril 10 MG tablet  Commonly known as:  PRINIVIL,ZESTRIL  Take by mouth.     metoprolol succinate 25 MG 24 hr tablet  Commonly known as:  TOPROL-XL  Take 1 tablet (25 mg total) by mouth daily.     mometasone 0.1 % cream  Commonly known as:  ELOCON  Apply 1 application topically daily.     PLAVIX 75 MG tablet  Generic drug:  clopidogrel  Take by mouth.     traMADol 50 MG tablet  Commonly known as:  ULTRAM  Take 1 tablet (50 mg total) by mouth every 6 (six) hours as needed.        Allergies: No Known Allergies  Family History: Family History  Problem Relation Age of Onset  . Cancer Mother   . Kidney disease Neg Hx   . Prostate cancer Neg Hx     Social History:  reports that he quit  smoking about 52 years ago. His smoking use included Cigarettes. He quit after 3 years of use. He has never used smokeless tobacco. He reports that he does not drink alcohol or use illicit drugs.  ROS:                                        Physical Exam: There were no vitals taken for this visit.  Constitutional:  Alert and oriented, No acute distress. HEENT: [place] AT, moist mucus membranes.  Trachea midline, no masses. Cardiovascular: No clubbing, cyanosis, or edema. Respiratory: Normal respiratory effort, no increased work of breathing. GI: Abdomen is soft, nontender, nondistended, no abdominal masses GU: No CVA tenderness. *** Skin: No rashes, bruises or suspicious lesions. Lymph: No cervical or inguinal adenopathy. Neurologic: Grossly intact, no focal deficits, moving all 4 extremities. Psychiatric: Normal mood and affect.  Laboratory Data: Lab Results  Component Value Date   WBC 5.9 10/18/2014   HGB 12.0* 03/21/2013   HCT 32.6* 10/18/2014   MCV 82 03/21/2013   PLT 302 03/21/2013    Lab Results  Component Value Date   CREATININE 1.38* 10/18/2014    Lab Results  Component Value Date   PSA 99.6* 10/18/2014    No results found for: TESTOSTERONE  No results found for: HGBA1C  Urinalysis No results found for: COLORURINE, APPEARANCEUR, LABSPEC, PHURINE, GLUCOSEU, HGBUR, BILIRUBINUR, KETONESUR, PROTEINUR, UROBILINOGEN, NITRITE, LEUKOCYTESUR  Pertinent Imaging: bone scan shows  Eighth and ninth rib positive for metastatic disease. CAT scan of the pelvis shows no significant nodal disease. [place] bone scan negative for immediate or long-term Chan's of fracture greater than 1%.   Assessment & Plan:  ***  There are no diagnoses linked to this encounter.  No Follow-up on file.  Collier Flowers, [facility] [address] [place], [place] [address] [phone]

## 2015-01-02 ENCOUNTER — Ambulatory Visit (INDEPENDENT_AMBULATORY_CARE_PROVIDER_SITE_OTHER): Payer: Medicare PPO | Admitting: Urology

## 2015-01-02 ENCOUNTER — Encounter: Payer: Self-pay | Admitting: Urology

## 2015-01-02 VITALS — BP 159/84 | HR 86 | Ht 70.5 in | Wt 228.3 lb

## 2015-01-02 DIAGNOSIS — C61 Malignant neoplasm of prostate: Secondary | ICD-10-CM

## 2015-01-02 DIAGNOSIS — R300 Dysuria: Secondary | ICD-10-CM | POA: Diagnosis not present

## 2015-01-02 LAB — URINALYSIS, COMPLETE
Bilirubin, UA: NEGATIVE
Glucose, UA: NEGATIVE
Ketones, UA: NEGATIVE
LEUKOCYTES UA: NEGATIVE
Nitrite, UA: NEGATIVE
PH UA: 7 (ref 5.0–7.5)
Protein, UA: NEGATIVE
SPEC GRAV UA: 1.02 (ref 1.005–1.030)
Urobilinogen, Ur: 0.2 mg/dL (ref 0.2–1.0)

## 2015-01-02 LAB — MICROSCOPIC EXAMINATION
EPITHELIAL CELLS (NON RENAL): NONE SEEN /HPF (ref 0–10)
RENAL EPITHEL UA: NONE SEEN /HPF
WBC, UA: NONE SEEN /hpf (ref 0–?)

## 2015-01-02 MED ORDER — DEGARELIX ACETATE 80 MG ~~LOC~~ SOLR
80.0000 mg | Freq: Once | SUBCUTANEOUS | Status: AC
  Administered 2015-01-02: 80 mg via SUBCUTANEOUS

## 2015-01-02 NOTE — Progress Notes (Signed)
Patient in for follow-up visit for firmer gun and discussion of treatment. We rediscussed the need for him to have radiation therapy along with hormonal ablation of his testosterone because of a initial presenting PSA of 100 100 and a very high-grade prostatic cancer in 9 of 12 cores grade 8-9. I saw him with the navigator and we have referred him properly to Dr. Donella Stade. Patient had a lot of questions which we answered in regards to side effects of radiation what radiation was Y he was not eligible for surgery. He'll be seen in follow-up in this office in a month for a 6 month Lupron injection. He will be navigated through the oncology center for his initial radiation consult. We will also place cold seeds in this patient in the future when he is ready to start his radiation we went over the number of therapies he would need how long it would take. Gets some questions about his sexual function talked about. At this point I think it's more important that we treat his progressive cancer and then worry about his sex life. He will address that issue when he comes back after his radiation

## 2015-01-02 NOTE — Progress Notes (Signed)
Firmagon Sub Q Injection  Due to Prostate Cancer patient is present today for a Firmagon Injection.   Medication: Firmagon (Degarelix)  Dose: 80MG  Location: right upper abdomen Lot:[id number] Exp:05/2016  Patient tolerated well, no complications were noted  Performed by: Vickki Hearing  Follow up:  28 days

## 2015-01-13 ENCOUNTER — Ambulatory Visit
Admission: RE | Admit: 2015-01-13 | Discharge: 2015-01-13 | Disposition: A | Discharge status: Home / Self Care | Payer: Medicare PPO | Source: Ambulatory Visit | Attending: Radiation Oncology | Admitting: Radiation Oncology

## 2015-01-13 ENCOUNTER — Telehealth: Payer: Self-pay | Admitting: *Deleted

## 2015-01-13 ENCOUNTER — Other Ambulatory Visit: Payer: Self-pay | Admitting: *Deleted

## 2015-01-13 ENCOUNTER — Encounter: Payer: Self-pay | Admitting: Radiation Oncology

## 2015-01-13 VITALS — BP 146/79 | HR 89 | Temp 96.4°F | Resp 18 | Wt 225.8 lb

## 2015-01-13 DIAGNOSIS — M2578 Osteophyte, vertebrae: Secondary | ICD-10-CM | POA: Diagnosis not present

## 2015-01-13 DIAGNOSIS — C61 Malignant neoplasm of prostate: Secondary | ICD-10-CM

## 2015-01-13 DIAGNOSIS — R948 Abnormal results of function studies of other organs and systems: Secondary | ICD-10-CM | POA: Diagnosis present

## 2015-01-13 DIAGNOSIS — Z510 Encounter for antineoplastic radiation therapy: Secondary | ICD-10-CM | POA: Insufficient documentation

## 2015-01-13 MED ORDER — TAMSULOSIN HCL 0.4 MG PO CAPS: 0.4000 mg | ORAL_CAPSULE | Freq: Every day | ORAL | Status: DC

## 2015-01-13 NOTE — Telephone Encounter (Signed)
Spoke with Mr. Schetter regarding upcoming appointments.  He has an appt. For gold seed marker placement with Dr. Elnoria Howard on 01/15/15 at 11:00.  Simulation with Dr. Baruch Gouty on 11/7 at 10:00.  He confirmed and read back appt times.  He is going to pick up gold seed markers tomorrow from the cancer center.

## 2015-01-13 NOTE — Consult Note (Signed)
Except an outstanding is perfect of Radiation Oncology NEW PATIENT EVALUATION  Name: Bradley Chaney  MRN: [id number]  Date:   01/13/2015     DOB: [birth year]   This 74 y.o. male patient presents to the clinic for initial evaluation of prostate cancer at least stage IIa (T1 cN0 M0) mostly Gleason 8 (4+4) presenting the PSA of. 100  REFERRING PHYSICIAN: Margarita Rana, MD  CHIEF COMPLAINT:  Chief Complaint  Patient presents with  . Prostate Cancer    Pt is here for initial consultation of prostate cancer.      DIAGNOSIS: The encounter diagnosis was Malignant neoplasm of prostate ([place]).   PREVIOUS INVESTIGATIONS:  Bone scan and CT scans reviewed Surgical pathology report reviewed Clinical notes reviewed  HPI: Patient is a 74 year old male initially seen for increasing pain in his knees was noted by his PMD to have a PSA of 100. He was seen by urology underwent transrectal ultrasound-guided biopsy which was positive for bilateral adenocarcinoma mostly Gleason 8 (4+4) with 1 core showing Gleason 8 (3+5). He had a bone scan showing some increased uptake in the ninth rib I have done plain films and this shows osteophyte formation not thought to be metastatic disease. CT scan showed some prominent pelvic lymph nodes but nothing to suggest nodal metastasis. Patient does have urgency and frequency of urination with nocturia 6. He does have some lower back pain and knee pain which may be related to arthritis. He is referred today for radiation oncology for consideration of treatment. Patient does have extensive comorbidities including history of coronary artery disease cardiomyopathy.  PLANNED TREATMENT REGIMEN: I MRT radiation therapy to both prostate and pelvic nodes  PAST MEDICAL HISTORY:  has a past medical history of Hyperglycemia; Hypertension; CAD (coronary artery disease); Hyperlipidemia; Neck mass; Hematochezia; Anemia; Dizziness; Eczema; Myocardial infarction ([place]); Acid reflux;  Arthritis; Heart attack ([place]); Prostate cancer ([place]) (10/2014); Benign essential HTN (06/25/2014); Arteriosclerosis of coronary artery (06/25/2014); Cardiomyopathy, ischemic (07/23/2014); Dermatitis, eczematoid (10/17/2014); and Osteoarthritis (10/18/2014).    PAST SURGICAL HISTORY:  Past Surgical History  Procedure Laterality Date  . Coronary stent placement      RCA  . Cardiac surgery      MIX2, stents placed    FAMILY HISTORY: family history includes Cancer in his mother. There is no history of Kidney disease or Prostate cancer.  SOCIAL HISTORY:  reports that he quit smoking about 52 years ago. His smoking use included Cigarettes. He quit after 3 years of use. He has never used smokeless tobacco. He reports that he does not drink alcohol or use illicit drugs.  ALLERGIES: Review of patient's allergies indicates no known allergies.  MEDICATIONS:  Current Outpatient Prescriptions  Medication Sig Dispense Refill  . amLODipine (NORVASC) 10 MG tablet Take 1 tablet (10 mg total) by mouth daily. 90 tablet 3  . aspirin EC 81 MG tablet Take 81 mg by mouth daily.    Marland Kitchen atorvastatin (LIPITOR) 40 MG tablet 1 TABLET TABLET, ORAL, DAILY 90 tablet 3  . clopidogrel (PLAVIX) 75 MG tablet Take by mouth.    . hydrochlorothiazide (MICROZIDE) 12.5 MG capsule Take 12.5 mg by mouth daily.    Marland Kitchen lisinopril (PRINIVIL,ZESTRIL) 20 MG tablet     . metoprolol succinate (TOPROL-XL) 25 MG 24 hr tablet Take 1 tablet (25 mg total) by mouth daily. 90 tablet 3  . mometasone (ELOCON) 0.1 % cream Apply 1 application topically daily.    . traMADol (ULTRAM) 50 MG tablet Take 1 tablet (50 mg  total) by mouth every 6 (six) hours as needed. 120 tablet 3  . tamsulosin (FLOMAX) 0.4 MG CAPS capsule Take 1 capsule (0.4 mg total) by mouth daily after supper. 30 capsule 6   No current facility-administered medications for this encounter.    ECOG PERFORMANCE STATUS:  1 - Symptomatic but completely ambulatory  REVIEW OF SYSTEMS:   Patient denies any weight loss, fatigue, weakness, fever, chills or night sweats. Patient denies any loss of vision, blurred vision. Patient denies any ringing  of the ears or hearing loss. No irregular heartbeat. Patient denies heart murmur or history of fainting. Patient denies any chest pain or pain radiating to her upper extremities. Patient denies any shortness of breath, difficulty breathing at night, cough or hemoptysis. Patient denies any swelling in the lower legs. Patient denies any nausea vomiting, vomiting of blood, or coffee ground material in the vomitus. Patient denies any stomach pain. Patient states has had normal bowel movements no significant constipation or diarrhea. Patient denies any dysuria, hematuria or significant nocturia. Patient denies any problems walking, swelling in the joints or loss of balance. Patient denies any skin changes, loss of hair or loss of weight. Patient denies any excessive worrying or anxiety or significant depression. Patient denies any problems with insomnia. Patient denies excessive thirst, polyuria, polydipsia. Patient denies any swollen glands, patient denies easy bruising or easy bleeding. Patient denies any recent infections, allergies or URI. Patient "s visual fields have not changed significantly in recent time.    PHYSICAL EXAM: BP 146/79 mmHg  Pulse 89  Temp(Src) 96.4 F (35.8 C)  Resp 18  Wt 225 lb 12 oz (102.4 kg) Rectal exam is difficult although I cannot determine any discrete nodularity or mass his prostate. Well-developed well-nourished patient in NAD. HEENT reveals PERLA, EOMI, discs not visualized.  Oral cavity is clear. No oral mucosal lesions are identified. Neck is clear without evidence of cervical or supraclavicular adenopathy. Lungs are clear to A&P. Cardiac examination is essentially unremarkable with regular rate and rhythm without murmur rub or thrill. Abdomen is benign with no organomegaly or masses noted. Motor sensory and DTR  levels are equal and symmetric in the upper and lower extremities. Cranial nerves II through XII are grossly intact. Proprioception is intact. No peripheral adenopathy or edema is identified. No motor or sensory levels are noted. Crude visual fields are within normal range.  LABORATORY DATA: Pathology report reviewed    RADIOLOGY RESULTS: CT scan and bone scan and plain films reviewed   IMPRESSION: At least stage IIa (T1 cN0 M0) high-grade Gleason 8 adenocarcinoma the prostate presenting with a PSA of 100  PLAN: At this time patient has no overt evidence of metastatic disease to his bone. I believe I would still treat him with curative intent with I MRT radiation therapy to both his prostate and pelvic nodes. Would plan on delivering 8000 cGy to his prostate using image guided technique and dose painting to his pelvic lymph nodes to 5400 cGy over 8 weeks. I have asked urology to place gold fiduciary markers in his prostate for daily image guided treatment. Patient is also been started on firma gone [facility] agonist and will suppress the patient for at least a year and a half to 2 years. Risks and benefits of radiation including possible diarrhea, increasing lower urinary tract symptoms, fatigue, alteration of blood counts, increased chance of erectile dysfunction although that is present already all were discussed in detail with the patient. I'm also starting the patient on  Flomax to help with some of his lower urinary tract symptoms. I have asked urology to place markers and have set him up for CT simulation shortly thereafter.  I would like to take this opportunity for allowing me to participate in the care of your patient.Armstead Peaks., MD

## 2015-01-15 ENCOUNTER — Other Ambulatory Visit: Payer: Self-pay | Admitting: Family Medicine

## 2015-01-15 ENCOUNTER — Other Ambulatory Visit: Payer: Medicare PPO | Admitting: Urology

## 2015-01-15 ENCOUNTER — Telehealth: Payer: Self-pay

## 2015-01-15 DIAGNOSIS — I10 Essential (primary) hypertension: Secondary | ICD-10-CM

## 2015-01-15 NOTE — Telephone Encounter (Signed)
  Oncology Nurse Navigator Documentation    Navigator Encounter Type: Telephone (01/15/15 0900)                      Time Spent with Patient: 30 (01/15/15 0900)   Spoke with Mr Mittleman on the phone. He reports he is taking plavix and asa. His appt for Dr Elnoria Howard to place prostate markers will need to be rescheduled. Dr Nehemiah Massed is his cardiologist. Judson Roch at [facility] made aware.

## 2015-01-20 ENCOUNTER — Ambulatory Visit: Payer: Medicare PPO

## 2015-02-05 ENCOUNTER — Ambulatory Visit (INDEPENDENT_AMBULATORY_CARE_PROVIDER_SITE_OTHER): Payer: Medicare PPO | Admitting: Urology

## 2015-02-05 VITALS — BP 154/80 | HR 86 | Ht 70.0 in | Wt 228.7 lb

## 2015-02-05 DIAGNOSIS — C61 Malignant neoplasm of prostate: Secondary | ICD-10-CM

## 2015-02-05 MED ORDER — GENTAMICIN SULFATE 40 MG/ML IJ SOLN
80.0000 mg | Freq: Once | INTRAMUSCULAR | Status: AC
  Administered 2015-02-05: 80 mg via INTRAMUSCULAR

## 2015-02-05 MED ORDER — LEVOFLOXACIN 500 MG PO TABS
500.0000 mg | ORAL_TABLET | Freq: Once | ORAL | Status: AC
  Administered 2015-02-05: 500 mg via ORAL

## 2015-02-05 NOTE — Progress Notes (Signed)
   Prostate Gold Seed Marker Placement Procedure   Informed consent was obtained after discussing risks/benefits of the procedure.  A time out was performed to ensure correct patient identity.  Pre-Procedure: - Gentamicin IM/ Levaquin given prophylactically   Procedure: -Transrectal ultrasound probe place in rectum, prostate gland visualized  - 3 fiducial gold seed markers placed, one at right base, one at left base, one at apex of prostate gland under transrectal ultrasound guidance  Post-Procedure: - Patient tolerated the procedure well - He was counseled to seek immediate medical attention if experiences any severe pain, significant bleeding, or fevers

## 2015-02-05 NOTE — Progress Notes (Signed)
02/05/2015 9:07 AM   Bradley Chaney 1940-05-01 [id number]  Referring provider: Margarita Rana, MD [address] [place], [place] [address]  Chief Complaint  Patient presents with  . Gold Seed Placement    HPI: 74 year old male with Gleason 4+4 prostate cancer, PSA of 100 who presents today for placement of gold seed fiducial markers.  No UTI symptoms today.  PMH: Past Medical History  Diagnosis Date  . Hyperglycemia   . Hypertension   . CAD (coronary artery disease)   . Hyperlipidemia   . Neck mass     Right side  . Hematochezia   . Anemia   . Dizziness   . Eczema   . Myocardial infarction ([place])   . Acid reflux   . Arthritis   . Heart attack ([place])   . Prostate cancer ([place]) 10/2014  . Benign essential HTN 06/25/2014  . Arteriosclerosis of coronary artery 06/25/2014    Overview:  S/p subendocardial MI, PCI and stent placement with cypher stent to right coronary artery and obtuse marginal 2 03/2008.   Cath 03/2008 showed 20 & 25% lad, 30% obtuse marginal 1, 30% left circumflex and stent in obtuse marginal 2 and right coronary artery.   acute STEMI with infererior wall hypokenesis and PCI stent placement of right coronary artery. 2014.    . Cardiomyopathy, ischemic 07/23/2014    Overview:  inferior hypokinesis ef 40%   . Dermatitis, eczematoid 10/17/2014  . Osteoarthritis 10/18/2014    Surgical History: Past Surgical History  Procedure Laterality Date  . Coronary stent placement      RCA  . Cardiac surgery      MIX2, stents placed    Home Medications:    Medication List       This list is accurate as of: 02/05/15  9:07 AM.  Always use your most recent med list.               amLODipine 10 MG tablet  Commonly known as:  NORVASC  Take 1 tablet (10 mg total) by mouth daily.     aspirin EC 81 MG tablet  Take 81 mg by mouth daily.     atorvastatin 40 MG tablet  Commonly known as:  LIPITOR  1 TABLET TABLET, ORAL, DAILY     hydrochlorothiazide 12.5  MG capsule  Commonly known as:  MICROZIDE  TAKE ONE CAPSULE BY MOUTH EVERY DAY     lisinopril 20 MG tablet  Commonly known as:  PRINIVIL,ZESTRIL     metoprolol succinate 25 MG 24 hr tablet  Commonly known as:  TOPROL-XL  Take 1 tablet (25 mg total) by mouth daily.     mometasone 0.1 % cream  Commonly known as:  ELOCON  Apply 1 application topically daily.     PLAVIX 75 MG tablet  Generic drug:  clopidogrel  Take by mouth.     tamsulosin 0.4 MG Caps capsule  Commonly known as:  FLOMAX  Take 1 capsule (0.4 mg total) by mouth daily after supper.     traMADol 50 MG tablet  Commonly known as:  ULTRAM  Take 1 tablet (50 mg total) by mouth every 6 (six) hours as needed.        Allergies: No Known Allergies  Family History: Family History  Problem Relation Age of Onset  . Cancer Mother   . Kidney disease Neg Hx   . Prostate cancer Neg Hx     Social History:  reports that he quit smoking  about 52 years ago. His smoking use included Cigarettes. He quit after 3 years of use. He has never used smokeless tobacco. He reports that he does not drink alcohol or use illicit drugs.   Physical Exam: BP 154/80 mmHg  Pulse 86  Ht 5\' 10"  (1.778 m)  Wt 228 lb 11.2 oz (103.738 kg)  BMI 32.82 kg/m2  Constitutional:  Alert and oriented, No acute distress. HEENT: [place] AT, moist mucus membranes.  Trachea midline, no masses. Cardiovascular: No clubbing, cyanosis, or edema. Respiratory: Normal respiratory effort, no increased work of breathing. GI: Abdomen is soft, nontender, nondistended, no abdominal masses GU: No CVA tenderness.  Skin: No rashes, bruises or suspicious lesions. Neurologic: Grossly intact, no focal deficits, moving all 4 extremities. Psychiatric: Normal mood and affect.  Laboratory Data: Lab Results  Component Value Date   WBC 5.9 10/18/2014   HGB 12.0* 03/21/2013   HCT 32.6* 10/18/2014   MCV 82 03/21/2013   PLT 302 03/21/2013    Lab Results  Component Value  Date   CREATININE 1.38* 10/18/2014    Lab Results  Component Value Date   PSA 99.6* 10/18/2014      Assessment & Plan:   1. Prostate cancer ([place]) Status post uncomplicated gold seed marker placement - gentamicin (GARAMYCIN) injection 80 mg; Inject 2 mLs (80 mg total) into the muscle once. - levofloxacin (LEVAQUIN) tablet 500 mg; Take 1 tablet (500 mg total) by mouth once.   Hollice Espy, MD  [facility] [address], [place], [place] [address] [phone]

## 2015-02-10 ENCOUNTER — Ambulatory Visit
Admission: RE | Admit: 2015-02-10 | Discharge: 2015-02-10 | Disposition: A | Discharge status: Home / Self Care | Payer: Medicare PPO | Source: Ambulatory Visit | Attending: Radiation Oncology | Admitting: Radiation Oncology

## 2015-02-10 DIAGNOSIS — C61 Malignant neoplasm of prostate: Secondary | ICD-10-CM | POA: Diagnosis not present

## 2015-02-10 DIAGNOSIS — Z510 Encounter for antineoplastic radiation therapy: Secondary | ICD-10-CM | POA: Diagnosis present

## 2015-02-14 ENCOUNTER — Other Ambulatory Visit: Payer: Self-pay | Admitting: *Deleted

## 2015-02-14 DIAGNOSIS — C61 Malignant neoplasm of prostate: Secondary | ICD-10-CM

## 2015-02-17 DIAGNOSIS — Z510 Encounter for antineoplastic radiation therapy: Secondary | ICD-10-CM | POA: Diagnosis not present

## 2015-02-19 ENCOUNTER — Ambulatory Visit
Admission: RE | Admit: 2015-02-19 | Discharge: 2015-02-19 | Disposition: A | Discharge status: Home / Self Care | Payer: Medicare PPO | Source: Ambulatory Visit | Attending: Radiation Oncology | Admitting: Radiation Oncology

## 2015-02-20 ENCOUNTER — Ambulatory Visit
Admission: RE | Admit: 2015-02-20 | Discharge: 2015-02-20 | Disposition: A | Discharge status: Home / Self Care | Payer: Medicare PPO | Source: Ambulatory Visit | Attending: Radiation Oncology | Admitting: Radiation Oncology

## 2015-02-20 DIAGNOSIS — Z510 Encounter for antineoplastic radiation therapy: Secondary | ICD-10-CM | POA: Diagnosis not present

## 2015-02-21 ENCOUNTER — Ambulatory Visit
Admission: RE | Admit: 2015-02-21 | Discharge: 2015-02-21 | Disposition: A | Discharge status: Home / Self Care | Payer: Medicare PPO | Source: Ambulatory Visit | Attending: Radiation Oncology | Admitting: Radiation Oncology

## 2015-02-21 DIAGNOSIS — Z510 Encounter for antineoplastic radiation therapy: Secondary | ICD-10-CM | POA: Diagnosis not present

## 2015-02-24 ENCOUNTER — Ambulatory Visit
Admission: RE | Admit: 2015-02-24 | Discharge: 2015-02-24 | Disposition: A | Discharge status: Home / Self Care | Payer: Medicare PPO | Source: Ambulatory Visit | Attending: Radiation Oncology | Admitting: Radiation Oncology

## 2015-02-24 DIAGNOSIS — Z510 Encounter for antineoplastic radiation therapy: Secondary | ICD-10-CM | POA: Diagnosis not present

## 2015-02-25 ENCOUNTER — Other Ambulatory Visit: Payer: Self-pay

## 2015-02-25 ENCOUNTER — Ambulatory Visit
Admission: RE | Admit: 2015-02-25 | Discharge: 2015-02-25 | Disposition: A | Discharge status: Home / Self Care | Payer: Medicare PPO | Source: Ambulatory Visit | Attending: Radiation Oncology | Admitting: Radiation Oncology

## 2015-02-25 DIAGNOSIS — Z510 Encounter for antineoplastic radiation therapy: Secondary | ICD-10-CM | POA: Diagnosis not present

## 2015-02-25 MED ORDER — MOMETASONE FUROATE 0.1 % EX CREA: TOPICAL_CREAM | CUTANEOUS | Status: DC

## 2015-02-25 NOTE — Telephone Encounter (Signed)
Kim from Dr. Donette Larry office (Cancer center) says pt would like a refill on his Elocon 0.1% Cream.  She says he complaining of itching on the right side of his chest and under his arm.    Thanks,   -Mickel Baas

## 2015-02-26 ENCOUNTER — Ambulatory Visit
Admission: RE | Admit: 2015-02-26 | Discharge: 2015-02-26 | Disposition: A | Discharge status: Home / Self Care | Payer: Medicare PPO | Source: Ambulatory Visit | Attending: Radiation Oncology | Admitting: Radiation Oncology

## 2015-02-26 DIAGNOSIS — Z510 Encounter for antineoplastic radiation therapy: Secondary | ICD-10-CM | POA: Diagnosis not present

## 2015-02-27 ENCOUNTER — Ambulatory Visit
Admission: RE | Admit: 2015-02-27 | Discharge: 2015-02-27 | Disposition: A | Discharge status: Home / Self Care | Payer: Medicare PPO | Source: Ambulatory Visit | Attending: Radiation Oncology | Admitting: Radiation Oncology

## 2015-02-27 DIAGNOSIS — Z510 Encounter for antineoplastic radiation therapy: Secondary | ICD-10-CM | POA: Diagnosis not present

## 2015-02-28 ENCOUNTER — Ambulatory Visit
Admission: RE | Admit: 2015-02-28 | Discharge: 2015-02-28 | Disposition: A | Discharge status: Home / Self Care | Payer: Medicare PPO | Source: Ambulatory Visit | Attending: Radiation Oncology | Admitting: Radiation Oncology

## 2015-02-28 DIAGNOSIS — Z510 Encounter for antineoplastic radiation therapy: Secondary | ICD-10-CM | POA: Diagnosis not present

## 2015-03-03 ENCOUNTER — Ambulatory Visit
Admission: RE | Admit: 2015-03-03 | Discharge: 2015-03-03 | Disposition: A | Discharge status: Home / Self Care | Payer: Medicare PPO | Source: Ambulatory Visit | Attending: Radiation Oncology | Admitting: Radiation Oncology

## 2015-03-03 DIAGNOSIS — Z510 Encounter for antineoplastic radiation therapy: Secondary | ICD-10-CM | POA: Diagnosis not present

## 2015-03-04 ENCOUNTER — Ambulatory Visit
Admission: RE | Admit: 2015-03-04 | Discharge: 2015-03-04 | Disposition: A | Discharge status: Home / Self Care | Payer: Medicare PPO | Source: Ambulatory Visit | Attending: Radiation Oncology | Admitting: Radiation Oncology

## 2015-03-04 DIAGNOSIS — Z510 Encounter for antineoplastic radiation therapy: Secondary | ICD-10-CM | POA: Diagnosis not present

## 2015-03-05 ENCOUNTER — Ambulatory Visit
Admission: RE | Admit: 2015-03-05 | Discharge: 2015-03-05 | Disposition: A | Discharge status: Home / Self Care | Payer: Medicare PPO | Source: Ambulatory Visit | Attending: Radiation Oncology | Admitting: Radiation Oncology

## 2015-03-05 ENCOUNTER — Other Ambulatory Visit: Payer: Self-pay | Admitting: *Deleted

## 2015-03-05 ENCOUNTER — Inpatient Hospital Stay: Payer: Medicare PPO | Attending: Radiation Oncology

## 2015-03-05 DIAGNOSIS — C61 Malignant neoplasm of prostate: Secondary | ICD-10-CM | POA: Diagnosis present

## 2015-03-05 DIAGNOSIS — Z510 Encounter for antineoplastic radiation therapy: Secondary | ICD-10-CM | POA: Diagnosis not present

## 2015-03-05 LAB — CBC
HEMATOCRIT: 32.4 % — AB (ref 40.0–52.0)
Hemoglobin: 11 g/dL — ABNORMAL LOW (ref 13.0–18.0)
MCH: 27.8 pg (ref 26.0–34.0)
MCHC: 33.9 g/dL (ref 32.0–36.0)
MCV: 82 fL (ref 80.0–100.0)
PLATELETS: 250 10*3/uL (ref 150–440)
RBC: 3.95 MIL/uL — AB (ref 4.40–5.90)
RDW: 16.9 % — ABNORMAL HIGH (ref 11.5–14.5)
WBC: 5.9 10*3/uL (ref 3.8–10.6)

## 2015-03-06 ENCOUNTER — Ambulatory Visit
Admission: RE | Admit: 2015-03-06 | Discharge: 2015-03-06 | Disposition: A | Discharge status: Home / Self Care | Payer: Medicare PPO | Source: Ambulatory Visit | Attending: Radiation Oncology | Admitting: Radiation Oncology

## 2015-03-06 DIAGNOSIS — Z510 Encounter for antineoplastic radiation therapy: Secondary | ICD-10-CM | POA: Diagnosis not present

## 2015-03-07 ENCOUNTER — Ambulatory Visit
Admission: RE | Admit: 2015-03-07 | Discharge: 2015-03-07 | Disposition: A | Discharge status: Home / Self Care | Payer: Medicare PPO | Source: Ambulatory Visit | Attending: Radiation Oncology | Admitting: Radiation Oncology

## 2015-03-07 DIAGNOSIS — Z510 Encounter for antineoplastic radiation therapy: Secondary | ICD-10-CM | POA: Diagnosis not present

## 2015-03-11 ENCOUNTER — Ambulatory Visit
Admission: RE | Admit: 2015-03-11 | Discharge: 2015-03-11 | Disposition: A | Discharge status: Home / Self Care | Payer: Medicare PPO | Source: Ambulatory Visit | Attending: Radiation Oncology | Admitting: Radiation Oncology

## 2015-03-11 DIAGNOSIS — Z510 Encounter for antineoplastic radiation therapy: Secondary | ICD-10-CM | POA: Diagnosis not present

## 2015-03-12 ENCOUNTER — Ambulatory Visit
Admission: RE | Admit: 2015-03-12 | Discharge: 2015-03-12 | Disposition: A | Discharge status: Home / Self Care | Payer: Medicare PPO | Source: Ambulatory Visit | Attending: Radiation Oncology | Admitting: Radiation Oncology

## 2015-03-12 DIAGNOSIS — Z510 Encounter for antineoplastic radiation therapy: Secondary | ICD-10-CM | POA: Diagnosis not present

## 2015-03-13 ENCOUNTER — Ambulatory Visit
Admission: RE | Admit: 2015-03-13 | Discharge: 2015-03-13 | Disposition: A | Discharge status: Home / Self Care | Payer: Medicare PPO | Source: Ambulatory Visit | Attending: Radiation Oncology | Admitting: Radiation Oncology

## 2015-03-13 DIAGNOSIS — Z510 Encounter for antineoplastic radiation therapy: Secondary | ICD-10-CM | POA: Diagnosis not present

## 2015-03-14 ENCOUNTER — Ambulatory Visit
Admission: RE | Admit: 2015-03-14 | Discharge: 2015-03-14 | Disposition: A | Discharge status: Home / Self Care | Payer: Medicare PPO | Source: Ambulatory Visit | Attending: Radiation Oncology | Admitting: Radiation Oncology

## 2015-03-14 DIAGNOSIS — Z510 Encounter for antineoplastic radiation therapy: Secondary | ICD-10-CM | POA: Diagnosis not present

## 2015-03-18 ENCOUNTER — Ambulatory Visit
Admission: RE | Admit: 2015-03-18 | Discharge: 2015-03-18 | Disposition: A | Discharge status: Home / Self Care | Payer: Medicare PPO | Source: Ambulatory Visit | Attending: Radiation Oncology | Admitting: Radiation Oncology

## 2015-03-18 DIAGNOSIS — Z510 Encounter for antineoplastic radiation therapy: Secondary | ICD-10-CM | POA: Diagnosis not present

## 2015-03-19 ENCOUNTER — Ambulatory Visit
Admission: RE | Admit: 2015-03-19 | Discharge: 2015-03-19 | Disposition: A | Discharge status: Home / Self Care | Payer: Medicare PPO | Source: Ambulatory Visit | Attending: Radiation Oncology | Admitting: Radiation Oncology

## 2015-03-19 ENCOUNTER — Inpatient Hospital Stay: Payer: Medicare PPO | Attending: Radiation Oncology

## 2015-03-19 DIAGNOSIS — C61 Malignant neoplasm of prostate: Secondary | ICD-10-CM | POA: Insufficient documentation

## 2015-03-19 DIAGNOSIS — Z510 Encounter for antineoplastic radiation therapy: Secondary | ICD-10-CM | POA: Diagnosis not present

## 2015-03-20 ENCOUNTER — Ambulatory Visit
Admission: RE | Admit: 2015-03-20 | Discharge: 2015-03-20 | Disposition: A | Discharge status: Home / Self Care | Payer: Medicare PPO | Source: Ambulatory Visit | Attending: Radiation Oncology | Admitting: Radiation Oncology

## 2015-03-20 DIAGNOSIS — Z510 Encounter for antineoplastic radiation therapy: Secondary | ICD-10-CM | POA: Diagnosis not present

## 2015-03-21 ENCOUNTER — Ambulatory Visit
Admission: RE | Admit: 2015-03-21 | Discharge: 2015-03-21 | Disposition: A | Discharge status: Home / Self Care | Payer: Medicare PPO | Source: Ambulatory Visit | Attending: Radiation Oncology | Admitting: Radiation Oncology

## 2015-03-21 DIAGNOSIS — Z510 Encounter for antineoplastic radiation therapy: Secondary | ICD-10-CM | POA: Diagnosis not present

## 2015-03-24 ENCOUNTER — Ambulatory Visit: Payer: Medicare PPO

## 2015-03-25 ENCOUNTER — Ambulatory Visit
Admission: RE | Admit: 2015-03-25 | Discharge: 2015-03-25 | Disposition: A | Discharge status: Home / Self Care | Payer: Medicare PPO | Source: Ambulatory Visit | Attending: Radiation Oncology | Admitting: Radiation Oncology

## 2015-03-25 DIAGNOSIS — Z510 Encounter for antineoplastic radiation therapy: Secondary | ICD-10-CM | POA: Diagnosis not present

## 2015-03-26 ENCOUNTER — Ambulatory Visit
Admission: RE | Admit: 2015-03-26 | Discharge: 2015-03-26 | Disposition: A | Discharge status: Home / Self Care | Payer: Medicare PPO | Source: Ambulatory Visit | Attending: Radiation Oncology | Admitting: Radiation Oncology

## 2015-03-26 DIAGNOSIS — Z510 Encounter for antineoplastic radiation therapy: Secondary | ICD-10-CM | POA: Diagnosis not present

## 2015-03-27 ENCOUNTER — Ambulatory Visit
Admission: RE | Admit: 2015-03-27 | Discharge: 2015-03-27 | Disposition: A | Discharge status: Home / Self Care | Payer: Medicare PPO | Source: Ambulatory Visit | Attending: Radiation Oncology | Admitting: Radiation Oncology

## 2015-03-27 ENCOUNTER — Other Ambulatory Visit: Payer: Self-pay

## 2015-03-27 DIAGNOSIS — Z510 Encounter for antineoplastic radiation therapy: Secondary | ICD-10-CM | POA: Diagnosis not present

## 2015-03-28 ENCOUNTER — Ambulatory Visit
Admission: RE | Admit: 2015-03-28 | Discharge: 2015-03-28 | Disposition: A | Discharge status: Home / Self Care | Payer: Medicare PPO | Source: Ambulatory Visit | Attending: Radiation Oncology | Admitting: Radiation Oncology

## 2015-03-28 DIAGNOSIS — Z510 Encounter for antineoplastic radiation therapy: Secondary | ICD-10-CM | POA: Diagnosis not present

## 2015-03-31 ENCOUNTER — Ambulatory Visit
Admission: RE | Admit: 2015-03-31 | Discharge: 2015-03-31 | Disposition: A | Discharge status: Home / Self Care | Payer: Medicare PPO | Source: Ambulatory Visit | Attending: Radiation Oncology | Admitting: Radiation Oncology

## 2015-03-31 DIAGNOSIS — Z510 Encounter for antineoplastic radiation therapy: Secondary | ICD-10-CM | POA: Diagnosis not present

## 2015-04-01 ENCOUNTER — Ambulatory Visit
Admission: RE | Admit: 2015-04-01 | Discharge: 2015-04-01 | Disposition: A | Discharge status: Home / Self Care | Payer: Medicare PPO | Source: Ambulatory Visit | Attending: Radiation Oncology | Admitting: Radiation Oncology

## 2015-04-01 ENCOUNTER — Other Ambulatory Visit: Payer: Self-pay | Admitting: *Deleted

## 2015-04-01 DIAGNOSIS — Z510 Encounter for antineoplastic radiation therapy: Secondary | ICD-10-CM | POA: Diagnosis not present

## 2015-04-01 MED ORDER — SUCRALFATE 1 G PO TABS: 1.0000 g | ORAL_TABLET | Freq: Three times a day (TID) | ORAL | Status: DC

## 2015-04-02 ENCOUNTER — Ambulatory Visit
Admission: RE | Admit: 2015-04-02 | Discharge: 2015-04-02 | Disposition: A | Discharge status: Home / Self Care | Payer: Medicare PPO | Source: Ambulatory Visit | Attending: Radiation Oncology | Admitting: Radiation Oncology

## 2015-04-02 ENCOUNTER — Inpatient Hospital Stay: Payer: Medicare PPO

## 2015-04-02 DIAGNOSIS — C61 Malignant neoplasm of prostate: Secondary | ICD-10-CM | POA: Diagnosis not present

## 2015-04-02 DIAGNOSIS — Z510 Encounter for antineoplastic radiation therapy: Secondary | ICD-10-CM | POA: Diagnosis not present

## 2015-04-02 LAB — CBC
HCT: 31.6 % — ABNORMAL LOW (ref 40.0–52.0)
HEMOGLOBIN: 10.8 g/dL — AB (ref 13.0–18.0)
MCH: 28.1 pg (ref 26.0–34.0)
MCHC: 34 g/dL (ref 32.0–36.0)
MCV: 82.7 fL (ref 80.0–100.0)
Platelets: 251 10*3/uL (ref 150–440)
RBC: 3.83 MIL/uL — AB (ref 4.40–5.90)
RDW: 17.9 % — ABNORMAL HIGH (ref 11.5–14.5)
WBC: 4.5 10*3/uL (ref 3.8–10.6)

## 2015-04-03 ENCOUNTER — Ambulatory Visit
Admission: RE | Admit: 2015-04-03 | Discharge: 2015-04-03 | Disposition: A | Discharge status: Home / Self Care | Payer: Medicare PPO | Source: Ambulatory Visit | Attending: Radiation Oncology | Admitting: Radiation Oncology

## 2015-04-03 DIAGNOSIS — Z510 Encounter for antineoplastic radiation therapy: Secondary | ICD-10-CM | POA: Diagnosis not present

## 2015-04-04 ENCOUNTER — Ambulatory Visit
Admission: RE | Admit: 2015-04-04 | Discharge: 2015-04-04 | Disposition: A | Discharge status: Home / Self Care | Payer: Medicare PPO | Source: Ambulatory Visit | Attending: Radiation Oncology | Admitting: Radiation Oncology

## 2015-04-04 DIAGNOSIS — Z510 Encounter for antineoplastic radiation therapy: Secondary | ICD-10-CM | POA: Diagnosis not present

## 2015-04-07 ENCOUNTER — Ambulatory Visit
Admission: RE | Admit: 2015-04-07 | Discharge: 2015-04-07 | Disposition: A | Discharge status: Home / Self Care | Payer: Medicare PPO | Source: Ambulatory Visit | Attending: Radiation Oncology | Admitting: Radiation Oncology

## 2015-04-07 DIAGNOSIS — Z510 Encounter for antineoplastic radiation therapy: Secondary | ICD-10-CM | POA: Diagnosis not present

## 2015-04-08 ENCOUNTER — Ambulatory Visit
Admission: RE | Admit: 2015-04-08 | Discharge: 2015-04-08 | Disposition: A | Discharge status: Home / Self Care | Payer: Medicare PPO | Source: Ambulatory Visit | Attending: Radiation Oncology | Admitting: Radiation Oncology

## 2015-04-08 DIAGNOSIS — Z510 Encounter for antineoplastic radiation therapy: Secondary | ICD-10-CM | POA: Diagnosis not present

## 2015-04-09 ENCOUNTER — Ambulatory Visit
Admission: RE | Admit: 2015-04-09 | Discharge: 2015-04-09 | Disposition: A | Discharge status: Home / Self Care | Payer: Medicare PPO | Source: Ambulatory Visit | Attending: Radiation Oncology | Admitting: Radiation Oncology

## 2015-04-09 ENCOUNTER — Ambulatory Visit (INDEPENDENT_AMBULATORY_CARE_PROVIDER_SITE_OTHER): Payer: Medicare PPO | Admitting: Urology

## 2015-04-09 ENCOUNTER — Encounter: Payer: Self-pay | Admitting: Urology

## 2015-04-09 VITALS — BP 143/77 | HR 82 | Ht 70.0 in | Wt 229.4 lb

## 2015-04-09 DIAGNOSIS — Z510 Encounter for antineoplastic radiation therapy: Secondary | ICD-10-CM | POA: Diagnosis not present

## 2015-04-09 DIAGNOSIS — C61 Malignant neoplasm of prostate: Secondary | ICD-10-CM

## 2015-04-09 MED ORDER — LEUPROLIDE ACETATE (6 MONTH) 45 MG IM KIT
45.0000 mg | PACK | Freq: Once | INTRAMUSCULAR | Status: AC
  Administered 2015-04-09: 45 mg via INTRAMUSCULAR

## 2015-04-09 NOTE — Progress Notes (Signed)
Lupron IM Injection   Due to Prostate Cancer patient is present today for a Lupron Injection.  Medication: Lupron 6 month Dose: 45mg   Location: right upper outer buttocks Lot: [id number]  Exp: 06/02/2016  Patient tolerated well, no complications were noted  Performed by: K.Hai Grabe,CMA  Follow up: 6 mths

## 2015-04-10 ENCOUNTER — Ambulatory Visit
Admission: RE | Admit: 2015-04-10 | Discharge: 2015-04-10 | Disposition: A | Discharge status: Home / Self Care | Payer: Medicare PPO | Source: Ambulatory Visit | Attending: Radiation Oncology | Admitting: Radiation Oncology

## 2015-04-10 DIAGNOSIS — Z510 Encounter for antineoplastic radiation therapy: Secondary | ICD-10-CM | POA: Diagnosis not present

## 2015-04-11 ENCOUNTER — Ambulatory Visit
Admission: RE | Admit: 2015-04-11 | Discharge: 2015-04-11 | Disposition: A | Discharge status: Home / Self Care | Payer: Medicare PPO | Source: Ambulatory Visit | Attending: Radiation Oncology | Admitting: Radiation Oncology

## 2015-04-11 DIAGNOSIS — Z510 Encounter for antineoplastic radiation therapy: Secondary | ICD-10-CM | POA: Diagnosis not present

## 2015-04-14 ENCOUNTER — Ambulatory Visit
Admission: RE | Admit: 2015-04-14 | Discharge: 2015-04-14 | Disposition: A | Discharge status: Home / Self Care | Payer: Medicare PPO | Source: Ambulatory Visit | Attending: Radiation Oncology | Admitting: Radiation Oncology

## 2015-04-14 DIAGNOSIS — Z510 Encounter for antineoplastic radiation therapy: Secondary | ICD-10-CM | POA: Diagnosis not present

## 2015-04-15 ENCOUNTER — Ambulatory Visit
Admission: RE | Admit: 2015-04-15 | Discharge: 2015-04-15 | Disposition: A | Discharge status: Home / Self Care | Payer: Medicare PPO | Source: Ambulatory Visit | Attending: Radiation Oncology | Admitting: Radiation Oncology

## 2015-04-15 DIAGNOSIS — Z510 Encounter for antineoplastic radiation therapy: Secondary | ICD-10-CM | POA: Diagnosis not present

## 2015-04-16 ENCOUNTER — Ambulatory Visit
Admission: RE | Admit: 2015-04-16 | Discharge: 2015-04-16 | Disposition: A | Discharge status: Home / Self Care | Payer: Medicare PPO | Source: Ambulatory Visit | Attending: Radiation Oncology | Admitting: Radiation Oncology

## 2015-04-16 DIAGNOSIS — Z510 Encounter for antineoplastic radiation therapy: Secondary | ICD-10-CM | POA: Diagnosis not present

## 2015-04-17 ENCOUNTER — Ambulatory Visit
Admission: RE | Admit: 2015-04-17 | Discharge: 2015-04-17 | Disposition: A | Discharge status: Home / Self Care | Payer: Medicare PPO | Source: Ambulatory Visit | Attending: Radiation Oncology | Admitting: Radiation Oncology

## 2015-04-17 DIAGNOSIS — Z510 Encounter for antineoplastic radiation therapy: Secondary | ICD-10-CM | POA: Diagnosis not present

## 2015-04-18 ENCOUNTER — Ambulatory Visit: Payer: Medicare PPO

## 2015-04-18 DIAGNOSIS — Z510 Encounter for antineoplastic radiation therapy: Secondary | ICD-10-CM | POA: Diagnosis not present

## 2015-04-20 ENCOUNTER — Encounter: Payer: Self-pay | Admitting: Urology

## 2015-04-20 NOTE — Progress Notes (Signed)
3:33 PM   Bradley Chaney 07-01-1940 [id number]  Referring provider: Margarita Rana, MD [address] [place], [place] [address]  Chief Complaint  Patient presents with  . Prostate Cancer    HPI: 75 year old male with T1c Gleason 4+4, 3+5 prostate cancer, PSA of 100 dx by Dr. Elnoria Howard on 10/2014 currently undergoing EBRT with Dr. Massie Maroon.    Metastatic work up including bone scan and CT abd/ pelvis without obvious metastatic diease but osteophyte formation int he 9-10th ribs and prominent pelvic LN but not diagnostic.    No UTI symptoms today.  He was started on Firmagon by Dr. Elnoria Howard and is overdue for his next ADT.  He is experience hot flashes related to ADT.    He is currently on Flomax for LUTS with significant urgency/ frequency/ nocturia.    PMH: Past Medical History  Diagnosis Date  . Hyperglycemia   . Hypertension   . CAD (coronary artery disease)   . Hyperlipidemia   . Neck mass     Right side  . Hematochezia   . Anemia   . Dizziness   . Eczema   . Myocardial infarction ([place])   . Acid reflux   . Arthritis   . Heart attack ([place])   . Prostate cancer ([place]) 10/2014  . Benign essential HTN 06/25/2014  . Arteriosclerosis of coronary artery 06/25/2014    Overview:  S/p subendocardial MI, PCI and stent placement with cypher stent to right coronary artery and obtuse marginal 2 03/2008.   Cath 03/2008 showed 20 & 25% lad, 30% obtuse marginal 1, 30% left circumflex and stent in obtuse marginal 2 and right coronary artery.   acute STEMI with infererior wall hypokenesis and PCI stent placement of right coronary artery. 2014.    . Cardiomyopathy, ischemic 07/23/2014    Overview:  inferior hypokinesis ef 40%   . Dermatitis, eczematoid 10/17/2014  . Osteoarthritis 10/18/2014    Surgical History: Past Surgical History  Procedure Laterality Date  . Coronary stent placement      RCA  . Cardiac surgery      MIX2, stents placed    Home Medications:    Medication List         This list is accurate as of: 04/09/15 11:59 PM.  Always use your most recent med list.               amLODipine 10 MG tablet  Commonly known as:  NORVASC  Take 1 tablet (10 mg total) by mouth daily.     aspirin EC 81 MG tablet  Take 81 mg by mouth daily.     atorvastatin 40 MG tablet  Commonly known as:  LIPITOR  1 TABLET TABLET, ORAL, DAILY     hydrochlorothiazide 12.5 MG capsule  Commonly known as:  MICROZIDE  TAKE ONE CAPSULE BY MOUTH EVERY DAY     lisinopril 20 MG tablet  Commonly known as:  PRINIVIL,ZESTRIL     metoprolol succinate 25 MG 24 hr tablet  Commonly known as:  TOPROL-XL  Take 1 tablet (25 mg total) by mouth daily.     mometasone 0.1 % cream  Commonly known as:  ELOCON  Apply Sparingly to affected area twice a day.     PLAVIX 75 MG tablet  Generic drug:  clopidogrel  Take by mouth. Reported on 04/09/2015     sucralfate 1 g tablet  Commonly known as:  CARAFATE  Take 1 tablet (1 g total) by mouth 3 (  three) times daily before meals.     tamsulosin 0.4 MG Caps capsule  Commonly known as:  FLOMAX  Take 1 capsule (0.4 mg total) by mouth daily after supper.     traMADol 50 MG tablet  Commonly known as:  ULTRAM  Take 1 tablet (50 mg total) by mouth every 6 (six) hours as needed.        Allergies: No Known Allergies  Family History: Family History  Problem Relation Age of Onset  . Cancer Mother   . Kidney disease Neg Hx   . Prostate cancer Neg Hx     Social History:  reports that he quit smoking about 52 years ago. His smoking use included Cigarettes. He quit after 3 years of use. He has never used smokeless tobacco. He reports that he does not drink alcohol or use illicit drugs.   Physical Exam: BP 143/77 mmHg  Pulse 82  Ht 5\' 10"  (1.778 m)  Wt 229 lb 6.4 oz (104.055 kg)  BMI 32.92 kg/m2  Constitutional:  Alert and oriented, No acute distress. HEENT: [place] AT, moist mucus membranes.  Trachea midline, no masses. Cardiovascular: No  clubbing, cyanosis, or edema. Respiratory: Normal respiratory effort, no increased work of breathing. GI: Abdomen is soft, nontender, nondistended, no abdominal masses GU: No CVA tenderness.  Skin: No rashes, bruises or suspicious lesions. Neurologic: Grossly intact, no focal deficits, moving all 4 extremities. Psychiatric: Normal mood and affect.  Laboratory Data: Lab Results  Component Value Date   WBC 4.5 04/02/2015   HGB 10.8* 04/02/2015   HCT 31.6* 04/02/2015   MCV 82.7 04/02/2015   PLT 251 04/02/2015    Lab Results  Component Value Date   CREATININE 1.38* 10/18/2014    Assessment & Plan:   1. Prostate cancer ([place]) High risk prostate cancer currently undergoing IMRT Lupron injection x 6 months given today, reviewed risk and benefits at length including risk of hot flshes, breast tenderness, loss of muscle mass, worsening cardiac issues, osteoporosis, actual side effects.  2. ADT/ bone health Discussed importance of bone health on ADT, recommend 1000-1200 mg daily calcium suppliment and [phone] IU vit D daily.  Also encouraged weight being exercises and cardiovascular health.  3. LUTS Continue flomax.  Consider addition of anticholinergic if void symptoms worsen. Anticipate improvement with Lupron and once radiation complete.   Return to care in 6 months to reassess voiding issues, Lupron injection.  Hollice Espy, MD  [facility] [address], [place], [place] [address] [phone]

## 2015-04-21 ENCOUNTER — Ambulatory Visit: Payer: Medicare PPO

## 2015-04-21 DIAGNOSIS — Z510 Encounter for antineoplastic radiation therapy: Secondary | ICD-10-CM | POA: Diagnosis not present

## 2015-05-16 ENCOUNTER — Other Ambulatory Visit: Payer: Self-pay | Admitting: Family Medicine

## 2015-05-16 DIAGNOSIS — M158 Other polyosteoarthritis: Secondary | ICD-10-CM

## 2015-05-16 NOTE — Telephone Encounter (Signed)
Printed, please fax or call in to pharmacy. Thank you.   

## 2015-05-26 ENCOUNTER — Ambulatory Visit: Payer: Medicare PPO | Attending: Radiation Oncology | Admitting: Radiation Oncology

## 2015-06-13 ENCOUNTER — Inpatient Hospital Stay: Admission: RE | Admit: 2015-06-13 | Payer: Medicare PPO | Source: Ambulatory Visit | Admitting: Radiation Oncology

## 2015-06-13 ENCOUNTER — Ambulatory Visit: Payer: Medicare PPO | Admitting: Radiation Oncology

## 2015-06-19 ENCOUNTER — Encounter: Payer: Self-pay | Admitting: Family Medicine

## 2015-06-19 ENCOUNTER — Ambulatory Visit (INDEPENDENT_AMBULATORY_CARE_PROVIDER_SITE_OTHER): Payer: Medicare PPO | Admitting: Family Medicine

## 2015-06-19 VITALS — BP 148/76 | HR 88 | Temp 98.5°F | Resp 16 | Ht 70.0 in | Wt 229.0 lb

## 2015-06-19 DIAGNOSIS — I2510 Atherosclerotic heart disease of native coronary artery without angina pectoris: Secondary | ICD-10-CM | POA: Diagnosis not present

## 2015-06-19 DIAGNOSIS — Z23 Encounter for immunization: Secondary | ICD-10-CM

## 2015-06-19 DIAGNOSIS — D508 Other iron deficiency anemias: Secondary | ICD-10-CM

## 2015-06-19 DIAGNOSIS — I255 Ischemic cardiomyopathy: Secondary | ICD-10-CM

## 2015-06-19 DIAGNOSIS — C61 Malignant neoplasm of prostate: Secondary | ICD-10-CM | POA: Diagnosis not present

## 2015-06-19 DIAGNOSIS — R221 Localized swelling, mass and lump, neck: Secondary | ICD-10-CM | POA: Diagnosis not present

## 2015-06-19 DIAGNOSIS — R739 Hyperglycemia, unspecified: Secondary | ICD-10-CM

## 2015-06-19 DIAGNOSIS — E782 Mixed hyperlipidemia: Secondary | ICD-10-CM

## 2015-06-19 DIAGNOSIS — Z1211 Encounter for screening for malignant neoplasm of colon: Secondary | ICD-10-CM

## 2015-06-19 DIAGNOSIS — Z0000 Encounter for general adult medical examination without abnormal findings: Secondary | ICD-10-CM | POA: Diagnosis not present

## 2015-06-19 DIAGNOSIS — I10 Essential (primary) hypertension: Secondary | ICD-10-CM

## 2015-06-19 NOTE — Progress Notes (Signed)
Patient ID: Bradley Chaney, male   DOB: Apr 12, 1940, 75 y.o.   MRN: [id number]       Patient: Bradley Chaney, Male    DOB: 1940/06/02, 75 y.o.   MRN: [id number] Visit Date: 06/19/2015  Today's Provider: Margarita Rana, MD   Chief Complaint  Patient presents with  . Medicare Wellness   Subjective:    Annual wellness visit Bradley Chaney is a 75 y.o. male. He feels well. He reports exercising none. He reports he is sleeping well. Currently undergoing treatment for prostate cancer.  Does have lady friend and is thinking about getting engaged.     06/28/14 CPE  -----------------------------------------------------------   Review of Systems  Constitutional: Negative.   HENT: Positive for dental problem and rhinorrhea.   Eyes: Negative.   Respiratory: Negative.   Cardiovascular: Negative.   Gastrointestinal: Negative.   Endocrine: Negative.   Genitourinary: Negative.   Musculoskeletal: Positive for neck stiffness.  Skin: Negative.   Allergic/Immunologic: Negative.   Neurological: Negative.   Hematological: Negative.   Psychiatric/Behavioral: Negative.     Social History   Social History  . Marital Status: Widowed    Spouse Name: N/A  . Number of Children: 4  . Years of Education: College   Occupational History  . Retired    Social History Main Topics  . Smoking status: Former Smoker -- 3 years    Types: Cigarettes    Quit date: 09/10/1962  . Smokeless tobacco: Never Used  . Alcohol Use: No  . Drug Use: No  . Sexual Activity: Not on file   Other Topics Concern  . Not on file   Social History Narrative    Past Medical History  Diagnosis Date  . Hyperglycemia   . Hypertension   . CAD (coronary artery disease)   . Hyperlipidemia   . Neck mass     Right side  . Hematochezia   . Anemia   . Dizziness   . Eczema   . Myocardial infarction ([place])   . Acid reflux   . Arthritis   . Heart attack ([place])   . Prostate cancer ([place]) 10/2014  . Benign essential HTN  06/25/2014  . Arteriosclerosis of coronary artery 06/25/2014    Overview:  S/p subendocardial MI, PCI and stent placement with cypher stent to right coronary artery and obtuse marginal 2 03/2008.   Cath 03/2008 showed 20 & 25% lad, 30% obtuse marginal 1, 30% left circumflex and stent in obtuse marginal 2 and right coronary artery.   acute STEMI with infererior wall hypokenesis and PCI stent placement of right coronary artery. 2014.    . Cardiomyopathy, ischemic 07/23/2014    Overview:  inferior hypokinesis ef 40%   . Dermatitis, eczematoid 10/17/2014  . Osteoarthritis 10/18/2014     Patient Active Problem List   Diagnosis Date Noted  . Prostate cancer ([place]) 11/19/2014  . Knee pain 10/18/2014  . Osteoarthritis 10/18/2014  . Absolute anemia 10/17/2014  . Dermatitis, eczematoid 10/17/2014  . Blood glucose elevated 10/17/2014  . Cardiomyopathy, ischemic 07/23/2014  . Benign essential HTN 06/25/2014  . 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 Arteriosclerosis of coronary artery 06/25/2014  . Breathlessness on exertion 06/25/2014    Past Surgical History  Procedure Laterality Date  . Coronary stent placement      RCA  . Cardiac surgery      MIX2, stents placed    His family history includes Cancer in his mother. There is no history of  Kidney disease or Prostate cancer.    Previous Medications   AMLODIPINE (NORVASC) 10 MG TABLET    Take 1 tablet (10 mg total) by mouth daily.   ASPIRIN EC 81 MG TABLET    Take 81 mg by mouth daily.   ATORVASTATIN (LIPITOR) 40 MG TABLET    1 TABLET TABLET, ORAL, DAILY   CHOLECALCIFEROL (VITAMIN D-1000 MAX ST) 1000 UNITS TABLET    Take 1 capsule by mouth daily.   HYDROCHLOROTHIAZIDE (MICROZIDE) 12.5 MG CAPSULE    TAKE ONE CAPSULE BY MOUTH EVERY DAY   LISINOPRIL (PRINIVIL,ZESTRIL) 20 MG TABLET    Take 20 mg by mouth daily.    METOPROLOL SUCCINATE (TOPROL-XL) 25 MG 24 HR TABLET    Take 1 tablet (25 mg total) by mouth daily.   MOMETASONE  (ELOCON) 0.1 % CREAM    Apply Sparingly to affected area twice a day.   SUCRALFATE (CARAFATE) 1 G TABLET    Take 1 tablet (1 g total) by mouth 3 (three) times daily before meals.   TAMSULOSIN (FLOMAX) 0.4 MG CAPS CAPSULE    Take 1 capsule (0.4 mg total) by mouth daily after supper.   TRAMADOL (ULTRAM) 50 MG TABLET    TAKE 1 TABLET BY MOUTH EVERY 6 HOURS AS NEEDED    Patient Care Team: Margarita Rana, MD as PCP - General (Family Medicine) Clent Jacks, RN as Registered Nurse     Objective:   Vitals: BP 148/76 mmHg  Pulse 88  Temp(Src) 98.5 F (36.9 C) (Oral)  Resp 16  Ht 5\' 10"  (1.778 m)  Wt 229 lb (103.874 kg)  BMI 32.86 kg/m2  SpO2 98%  Physical Exam  Constitutional: He is oriented to person, place, and time. He appears well-developed and well-nourished.  HENT:  Head: Normocephalic and atraumatic.  Right Ear: External ear normal.  Left Ear: External ear normal.  Nose: Nose normal.  Mouth/Throat: Oropharynx is clear and moist.  Eyes: Conjunctivae and EOM are normal. Pupils are equal, round, and reactive to light.  Neck: Normal range of motion. Neck supple.  Cardiovascular: Normal rate, regular rhythm and normal heart sounds.   Pulmonary/Chest: Effort normal and breath sounds normal.  Abdominal: Soft. Bowel sounds are normal.  Musculoskeletal: Normal range of motion.  Lymphadenopathy:       Head (right side): Submandibular adenopathy present.  Neurological: He is alert and oriented to person, place, and time. He has normal reflexes.  Skin: Skin is warm and dry.  Psychiatric: He has a normal mood and affect. His behavior is normal. Judgment and thought content normal.    Activities of Daily Living In your present state of health, do you have any difficulty performing the following activities: 06/19/2015  Hearing? N  Vision? N  Difficulty concentrating or making decisions? N  Walking or climbing stairs? N  Dressing or bathing? N  Doing errands, shopping? N    Fall  Risk Assessment Fall Risk  06/19/2015 01/13/2015  Falls in the past year? No No     Depression Screen PHQ 2/9 Scores 06/19/2015 01/13/2015  PHQ - 2 Score 0 0    Cognitive Testing - 6-CIT  Correct? Score   What year is it? yes 0 0 or 4  What month is it? yes 0 0 or 3  Memorize:    Pia Mau,  [birth year],  [place],      What time is it? (within 1 hour) yes 0 0 or 3  Count backwards from  20 yes 0 0, 2, or 4  Name the months of the year yes 0 0, 2, or 4  Repeat name & address above no 2 0, 2, 4, 6, 8, or 10       TOTAL SCORE  2/28   Interpretation:  Normal  Normal (0-7) Abnormal (8-28)       Assessment & Plan:     Annual Wellness Visit  Reviewed patient's Family Medical History Reviewed and updated list of patient's medical providers Assessment of cognitive impairment was done Assessed patient's functional ability Established a written schedule for health screening [place] Completed and Reviewed  Exercise Activities and Dietary recommendations Goals    None      Immunization History  Administered Date(s) Administered  . Pneumococcal Conjugate-13 06/18/2014       1. Medicare annual wellness visit, subsequent Stable. Patient advised to continue eating healthy and exercise daily.  2. Benign essential HTN F/U pending lab report. - Comprehensive metabolic panel  3. Mass of right side of neck - Ambulatory referral to ENT  4. Other iron deficiency anemias - CBC with Differential/Platelet  5. Blood glucose elevated - Hemoglobin A1c  6. Combined fat and carbohydrate induced hyperlipemia - Lipid Panel With LDL/HDL Ratio - TSH  7. Prostate cancer ([place]) Stable. Patient advised to continue follow-up with urology and oncology.  8. Cardiomyopathy, ischemic F/B cardiology  9. Arteriosclerosis of coronary artery F/B cardiology  10. Colon cancer screening - Cologuard  11. Need for pneumococcal vaccination - Pneumococcal  polysaccharide vaccine 23-valent greater than or equal to 2yo subcutaneous/IM    Patient seen and examined by Dr. Jerrell Belfast, and note scribed by Philbert Riser. Dimas, CMA.  I have reviewed the document for accuracy and completeness and I agree with above. Jerrell Belfast, MD   Margarita Rana, MD    ------------------------------------------------------------------------------------------------------------

## 2015-06-20 LAB — LIPID PANEL WITH LDL/HDL RATIO
CHOLESTEROL TOTAL: 197 mg/dL (ref 100–199)
HDL: 55 mg/dL (ref >39)
LDL CALC: 121 mg/dL — AB (ref 0–99)
LDL/HDL RATIO: 2.2 ratio (ref 0.0–3.6)
Triglycerides: 105 mg/dL (ref 0–149)
VLDL CHOLESTEROL CAL: 21 mg/dL (ref 5–40)

## 2015-06-20 LAB — CBC WITH DIFFERENTIAL/PLATELET
Basophils Absolute: 0 10*3/uL (ref 0.0–0.2)
Basos: 0 %
EOS (ABSOLUTE): 0.4 10*3/uL (ref 0.0–0.4)
Eos: 8 %
Hematocrit: 30.9 % — ABNORMAL LOW (ref 37.5–51.0)
Hemoglobin: 10.9 g/dL — ABNORMAL LOW (ref 12.6–17.7)
IMMATURE GRANULOCYTES: 0 %
Immature Grans (Abs): 0 10*3/uL (ref 0.0–0.1)
Lymphocytes Absolute: 0.9 10*3/uL (ref 0.7–3.1)
Lymphs: 20 %
MCH: 28.8 pg (ref 26.6–33.0)
MCHC: 35.3 g/dL (ref 31.5–35.7)
MCV: 82 fL (ref 79–97)
MONOS ABS: 0.3 10*3/uL (ref 0.1–0.9)
Monocytes: 7 %
NEUTROS PCT: 65 %
Neutrophils Absolute: 3 10*3/uL (ref 1.4–7.0)
PLATELETS: 354 10*3/uL (ref 150–379)
RBC: 3.78 x10E6/uL — AB (ref 4.14–5.80)
RDW: 15.6 % — AB (ref 12.3–15.4)
WBC: 4.5 10*3/uL (ref 3.4–10.8)

## 2015-06-20 LAB — COMPREHENSIVE METABOLIC PANEL
A/G RATIO: 1.3 (ref 1.2–2.2)
ALK PHOS: 91 IU/L (ref 39–117)
ALT: 51 IU/L — AB (ref 0–44)
AST: 46 IU/L — AB (ref 0–40)
Albumin: 4.3 g/dL (ref 3.5–4.8)
BUN/Creatinine Ratio: 12 (ref 10–24)
BUN: 10 mg/dL (ref 8–27)
Bilirubin Total: 0.5 mg/dL (ref 0.0–1.2)
CALCIUM: 9.9 mg/dL (ref 8.6–10.2)
CO2: 29 mmol/L (ref 18–29)
Chloride: 100 mmol/L (ref 96–106)
Creatinine, Ser: 0.85 mg/dL (ref 0.76–1.27)
GFR calc Af Amer: 99 mL/min/{1.73_m2} (ref >59)
GFR, EST NON AFRICAN AMERICAN: 86 mL/min/{1.73_m2} (ref >59)
Globulin, Total: 3.4 g/dL (ref 1.5–4.5)
Glucose: 133 mg/dL — ABNORMAL HIGH (ref 65–99)
POTASSIUM: 4.9 mmol/L (ref 3.5–5.2)
Sodium: 145 mmol/L — ABNORMAL HIGH (ref 134–144)
Total Protein: 7.7 g/dL (ref 6.0–8.5)

## 2015-06-20 LAB — HEMOGLOBIN A1C
ESTIMATED AVERAGE GLUCOSE: 114 mg/dL
HEMOGLOBIN A1C: 5.6 % (ref 4.8–5.6)

## 2015-06-20 LAB — TSH: TSH: 1.72 u[IU]/mL (ref 0.450–4.500)

## 2015-06-23 ENCOUNTER — Telehealth: Payer: Self-pay

## 2015-06-23 NOTE — Telephone Encounter (Signed)
-----   Message from Margarita Rana, MD sent at 06/21/2015  2:13 PM EDT ----- Labs stable except for mildly elevated liver enzymes. Not enough to change any treatment. Recheck in 6 weeks for stability. Thanks.

## 2015-06-23 NOTE — Telephone Encounter (Signed)
LMTCB. sd  

## 2015-06-24 NOTE — Telephone Encounter (Signed)
Pt informed and voiced understanding of results. 

## 2015-07-04 ENCOUNTER — Encounter: Payer: Self-pay | Admitting: Radiation Oncology

## 2015-07-04 ENCOUNTER — Ambulatory Visit
Admission: RE | Admit: 2015-07-04 | Discharge: 2015-07-04 | Disposition: A | Discharge status: Home / Self Care | Payer: Medicare PPO | Source: Ambulatory Visit | Attending: Radiation Oncology | Admitting: Radiation Oncology

## 2015-07-04 ENCOUNTER — Other Ambulatory Visit: Payer: Self-pay | Admitting: *Deleted

## 2015-07-04 VITALS — BP 161/90 | HR 88 | Temp 96.6°F | Resp 20 | Wt 228.3 lb

## 2015-07-04 DIAGNOSIS — C61 Malignant neoplasm of prostate: Secondary | ICD-10-CM

## 2015-07-04 NOTE — Progress Notes (Signed)
Radiation Oncology Follow up Note  Name: UNKOWN DEFRONZO   Date:   07/04/2015 MRN:  [id number] DOB: March 05, 1941    This 75 y.o. male presents to the clinic today for follow-up for prostate cancer at least stage IIa (T1 CN 0 M0 mostly Gleason 8 (4+4) presenting the PSA of 100.Marland Kitchen  REFERRING PROVIDER: Margarita Rana, MD  HPI: Patient is a 75 year old male now out 1 month having completed IM RT radiation therapy to his prostate and pelvic nodes for Gleason 8 (4+4) adenocarcinoma presenting the PSA of 100. Bone scan was negative. He is seen today in routine follow-up is doing well. He states he continues to have some urinary frequency nocturia 3-4 continues to take Flomax. He is having no diarrhea or GI symptoms.Marland Kitchen He is on Lupron therapy by urology with his last six-month injection back in February.  COMPLICATIONS OF TREATMENT: none  FOLLOW UP COMPLIANCE: keeps appointments   PHYSICAL EXAM:  BP 161/90 mmHg  Pulse 88  Temp(Src) 96.6 F (35.9 C)  Resp 20  Wt 228 lb 4.6 oz (103.55 kg) On rectal exam rectal sphincter tone is good. Prostate is smooth contracted without evidence of nodularity or mass. Sulcus is preserved bilaterally. No discrete nodularity is identified. No other rectal abnormalities are noted. Well-developed well-nourished patient in NAD. HEENT reveals PERLA, EOMI, discs not visualized.  Oral cavity is clear. No oral mucosal lesions are identified. Neck is clear without evidence of cervical or supraclavicular adenopathy. Lungs are clear to A&P. Cardiac examination is essentially unremarkable with regular rate and rhythm without murmur rub or thrill. Abdomen is benign with no organomegaly or masses noted. Motor sensory and DTR levels are equal and symmetric in the upper and lower extremities. Cranial nerves II through XII are grossly intact. Proprioception is intact. No peripheral adenopathy or edema is identified. No motor or sensory levels are noted. Crude visual fields are within  normal range.  RADIOLOGY RESULTS: No current films for review  PLAN: I have asked him back in about 3 months for follow-up and will perform a PSA at that time. He will continue on Lupron therapy for probably at least 2 years based on his high risk stage. I have urged him to continue his Flomax. He continues close follow-up care with urology. Patient knows to call with any concerns.  I would like to take this opportunity for allowing me to participate in the care of your patient.Armstead Peaks., MD

## 2015-07-07 ENCOUNTER — Other Ambulatory Visit: Payer: Self-pay | Admitting: Unknown Physician Specialty

## 2015-07-07 DIAGNOSIS — R221 Localized swelling, mass and lump, neck: Secondary | ICD-10-CM

## 2015-07-15 ENCOUNTER — Ambulatory Visit
Admission: RE | Admit: 2015-07-15 | Discharge: 2015-07-15 | Disposition: A | Discharge status: Home / Self Care | Payer: Medicare PPO | Source: Ambulatory Visit | Attending: Unknown Physician Specialty | Admitting: Unknown Physician Specialty

## 2015-07-15 DIAGNOSIS — I700 Atherosclerosis of aorta: Secondary | ICD-10-CM | POA: Insufficient documentation

## 2015-07-15 DIAGNOSIS — M47812 Spondylosis without myelopathy or radiculopathy, cervical region: Secondary | ICD-10-CM | POA: Insufficient documentation

## 2015-07-15 DIAGNOSIS — R221 Localized swelling, mass and lump, neck: Secondary | ICD-10-CM | POA: Diagnosis not present

## 2015-07-15 MED ORDER — IOPAMIDOL (ISOVUE-300) INJECTION 61%
75.0000 mL | Freq: Once | INTRAVENOUS | Status: AC | PRN
  Administered 2015-07-15: 75 mL via INTRAVENOUS

## 2015-07-16 ENCOUNTER — Other Ambulatory Visit: Payer: Self-pay | Admitting: Family Medicine

## 2015-07-16 DIAGNOSIS — I10 Essential (primary) hypertension: Secondary | ICD-10-CM

## 2015-10-01 ENCOUNTER — Ambulatory Visit
Admission: RE | Admit: 2015-10-01 | Discharge: 2015-10-01 | Disposition: A | Discharge status: Home / Self Care | Payer: Medicare PPO | Source: Ambulatory Visit | Attending: Radiation Oncology | Admitting: Radiation Oncology

## 2015-10-01 ENCOUNTER — Encounter: Payer: Self-pay | Admitting: Radiation Oncology

## 2015-10-01 ENCOUNTER — Other Ambulatory Visit: Payer: Self-pay | Admitting: *Deleted

## 2015-10-01 ENCOUNTER — Inpatient Hospital Stay: Payer: Medicare PPO | Attending: Radiation Oncology

## 2015-10-01 VITALS — BP 151/90 | HR 77 | Temp 96.5°F | Wt 227.4 lb

## 2015-10-01 DIAGNOSIS — C61 Malignant neoplasm of prostate: Secondary | ICD-10-CM | POA: Insufficient documentation

## 2015-10-01 DIAGNOSIS — Z923 Personal history of irradiation: Secondary | ICD-10-CM | POA: Diagnosis not present

## 2015-10-01 LAB — PSA: PSA: 1.36 ng/mL (ref 0.00–4.00)

## 2015-10-01 MED ORDER — TAMSULOSIN HCL 0.4 MG PO CAPS: 0.4000 mg | ORAL_CAPSULE | Freq: Every day | ORAL | Status: DC

## 2015-10-01 NOTE — Progress Notes (Signed)
Radiation Oncology Follow up Note  Name: Bradley Chaney   Date:   10/01/2015 MRN:  [id number] DOB: 1940-08-05    This 75 y.o. male presents to the clinic today for follow-up of prostate cancer stage IIa high risk now out 4 months from I MRT radiation therapy.  REFERRING PROVIDER: Margarita Rana, MD  HPI: Patient is a 75 year old male now out 4 months having completed I MRT radiation therapy for a Gleason 8 (4+4) adenocarcinoma prostate presenting the PSA of 100. He is currently on Lupron suppression by urology he is seen today in routine follow-up and is doing well he specifically denies diarrhea dysuria or any other GI/GU complaints. He had a PSA drawn today. He does complain of some nocturia I believe had a month Flomax in the past I re-prescribing that at this time..  COMPLICATIONS OF TREATMENT: none  FOLLOW UP COMPLIANCE: keeps appointments   PHYSICAL EXAM:  BP 151/90 mmHg  Pulse 77  Temp(Src) 96.5 F (35.8 C)  Wt 227 lb 6.5 oz (103.15 kg) On rectal exam rectal sphincter tone is good. Prostate is smooth contracted without evidence of nodularity or mass. Sulcus is preserved bilaterally. No discrete nodularity is identified. No other rectal abnormalities are noted. Well-developed well-nourished patient in NAD. HEENT reveals PERLA, EOMI, discs not visualized.  Oral cavity is clear. No oral mucosal lesions are identified. Neck is clear without evidence of cervical or supraclavicular adenopathy. Lungs are clear to A&P. Cardiac examination is essentially unremarkable with regular rate and rhythm without murmur rub or thrill. Abdomen is benign with no organomegaly or masses noted. Motor sensory and DTR levels are equal and symmetric in the upper and lower extremities. Cranial nerves II through XII are grossly intact. Proprioception is intact. No peripheral adenopathy or edema is identified. No motor or sensory levels are noted. Crude visual fields are within normal range.  RADIOLOGY RESULTS: No  current films for review  PLAN: I've ordered a PSA and will report that separately. I've also prescribed Flomax for the patient which she's had in the past which should help with some of his nocturia and urinary frequency. Otherwise I'm please was overall progress. He continues on Lupron suppression by urology. I've asked to see him back in 6 months for follow-up. Patient is to call sooner with any concerns.  I would like to take this opportunity to thank you for allowing me to participate in the care of your patient.Armstead Peaks., MD

## 2015-10-08 ENCOUNTER — Ambulatory Visit: Payer: Medicare PPO | Admitting: Urology

## 2015-10-31 ENCOUNTER — Ambulatory Visit (INDEPENDENT_AMBULATORY_CARE_PROVIDER_SITE_OTHER): Payer: Medicare PPO | Admitting: Urology

## 2015-10-31 VITALS — BP 150/77 | HR 106 | Ht 70.0 in | Wt 223.0 lb

## 2015-10-31 DIAGNOSIS — C61 Malignant neoplasm of prostate: Secondary | ICD-10-CM | POA: Diagnosis not present

## 2015-10-31 DIAGNOSIS — R350 Frequency of micturition: Secondary | ICD-10-CM | POA: Diagnosis not present

## 2015-10-31 DIAGNOSIS — N529 Male erectile dysfunction, unspecified: Secondary | ICD-10-CM | POA: Diagnosis not present

## 2015-10-31 MED ORDER — LEUPROLIDE ACETATE (6 MONTH) 45 MG IM KIT
45.0000 mg | PACK | Freq: Once | INTRAMUSCULAR | Status: AC
  Administered 2015-10-31: 45 mg via INTRAMUSCULAR

## 2015-10-31 NOTE — Progress Notes (Signed)
11:13 AM  10/31/15   Kathryne Eriksson Yeater 02-Jan-1941 [id number]  Referring provider: Margarita Rana, MD [address] [place], [place] [address]  Chief Complaint  Patient presents with  . Prostate Cancer    74month with lupron    HPI:  75 year old male with T1c Gleason 4+4, 3+5 prostate cancer, PSA of 100 dx by Dr. Elnoria Howard on 10/2014 s/p EBRT with Dr. Massie Maroon.  Most recent PSA  Metastatic work up including bone scan and CT abd/ pelvis without obvious metastatic diease but osteophyte formation int he 9-10th ribs and prominent pelvic LN but not diagnostic.    He does have urinary frequency and nocturia, average 3-4 times nightly.  He prescribed Flomax by Dr. Baruch Gouty last month which has helped somewhat.    He was started on Firmagon by Dr. Elnoria Howard and transitioned to Lupron  He is experience hot flashes related to ADT but with improving frequency.  He is taking something over the counter which is helping. He cannot recall the name of this but states that the drugstore has ordered it for him as this is not one of the usual medications they carry.  He notes that he still has some knots in the stomach where his initial firmagon was given. These are not painful.  He just got married and is now interested in pursuing sexual intercourse which wasn't an issue for him in the past. He states that he has had some trouble maintaining his erections and may be adjusted in some medication for this. His desire is intact. He has no trouble ejaculating. No history of penile trauma, pain, or curvature. He has no contraindications for PDE 5 inhibitors.      IPSS    Row Name 10/31/15 1000         International Prostate Symptom Score   How often have you had the sensation of not emptying your bladder? Less than half the time     How often have you had to urinate less than every two hours? About half the time     How often have you found you stopped and started again several times when you urinated?  Less than half the time     How often have you found it difficult to postpone urination? Not at All     How often have you had a weak urinary stream? Not at All     How often have you had to strain to start urination? Not at All     How many times did you typically get up at night to urinate? 4 Times     Total IPSS Score 11       Quality of Life due to urinary symptoms   If you were to spend the rest of your life with your urinary condition just the way it is now how would you feel about that? Mixed        PMH: Past Medical History:  Diagnosis Date  . Acid reflux   . Anemia   . Arteriosclerosis of coronary artery 06/25/2014   Overview:  S/p subendocardial MI, PCI and stent placement with cypher stent to right coronary artery and obtuse marginal 2 03/2008.   Cath 03/2008 showed 20 & 25% lad, 30% obtuse marginal 1, 30% left circumflex and stent in obtuse marginal 2 and right coronary artery.   acute STEMI with infererior wall hypokenesis and PCI stent placement of right coronary artery. 2014.    . Arthritis   . Benign  essential HTN 06/25/2014  . CAD (coronary artery disease)   . Cardiomyopathy, ischemic 07/23/2014   Overview:  inferior hypokinesis ef 40%   . Dermatitis, eczematoid 10/17/2014  . Dizziness   . Eczema   . Heart attack ([place])   . Hematochezia   . Hyperglycemia   . Hyperlipidemia   . Hypertension   . Myocardial infarction ([place])   . Neck mass    Right side  . Osteoarthritis 10/18/2014  . Prostate cancer ([place]) 10/2014    Surgical History: Past Surgical History:  Procedure Laterality Date  . CARDIAC SURGERY     MIX2, stents placed  . CORONARY STENT PLACEMENT     RCA    Home Medications:    Medication List       Accurate as of 10/31/15 11:13 AM. Always use your most recent med list.          amLODipine 10 MG tablet Commonly known as:  NORVASC Take 1 tablet (10 mg total) by mouth daily.   aspirin EC 81 MG tablet Take 81 mg by mouth daily.   atorvastatin 40  MG tablet Commonly known as:  LIPITOR 1 TABLET TABLET, ORAL, DAILY   hydrochlorothiazide 12.5 MG capsule Commonly known as:  MICROZIDE TAKE ONE CAPSULE BY MOUTH EVERY DAY   lisinopril 20 MG tablet Commonly known as:  PRINIVIL,ZESTRIL Take 20 mg by mouth daily.   metoprolol succinate 25 MG 24 hr tablet Commonly known as:  TOPROL-XL Take 1 tablet (25 mg total) by mouth daily.   mometasone 0.1 % cream Commonly known as:  ELOCON Apply Sparingly to affected area twice a day.   sucralfate 1 g tablet Commonly known as:  CARAFATE Take 1 tablet (1 g total) by mouth 3 (three) times daily before meals.   tamsulosin 0.4 MG Caps capsule Commonly known as:  FLOMAX Take 1 capsule (0.4 mg total) by mouth daily after supper.   traMADol 50 MG tablet Commonly known as:  ULTRAM TAKE 1 TABLET BY MOUTH EVERY 6 HOURS AS NEEDED   VITAMIN D-1000 MAX ST 1000 units tablet Generic drug:  Cholecalciferol Take 1 capsule by mouth daily.       Allergies: No Known Allergies  Family History: Family History  Problem Relation Age of Onset  . Cancer Mother   . Kidney disease Neg Hx   . Prostate cancer Neg Hx     Social History:  reports that he quit smoking about 53 years ago. His smoking use included Cigarettes. He quit after 3.00 years of use. He has never used smokeless tobacco. He reports that he does not drink alcohol or use drugs.   Physical Exam: BP (!) 150/77   Pulse (!) 106   Ht 5\' 10"  (1.778 m)   Wt 223 lb (101.2 kg)   BMI 32.00 kg/m   Constitutional:  Alert and oriented, No acute distress. HEENT: [place] AT, moist mucus membranes.  Trachea midline, no masses. Cardiovascular: No clubbing, cyanosis, or edema. Respiratory: Normal respiratory effort, no increased work of breathing. GI: Abdomen is soft, nontender, nondistended, no abdominal masses  Skin: No rashes, bruises or suspicious lesions. Neurologic: Grossly intact, no focal deficits, moving all 4 extremities. Psychiatric: Normal  mood and affect.  Laboratory Data: Lab Results  Component Value Date   WBC 4.5 06/19/2015   HGB 10.8 (L) 04/02/2015   HCT 30.9 (L) 06/19/2015   MCV 82 06/19/2015   PLT 354 06/19/2015    Lab Results  Component Value Date   CREATININE  0.85 06/19/2015   Component     Latest Ref Rng & Units 10/01/2015  PSA     0.00 - 4.00 ng/mL 1.36    Assessment & Plan:   1. Prostate cancer ([place]) High risk prostate cancers/p IMRT Recommend 2-3 years of Lupron for high-risk cancer Lupron injection x 6 months again today, reviewed risks Most recent PSA is quite low but not undetectable as I would've anticipated that it might would be on androgen deprivation therapy. As such, I would like to repeat his PSA in 3 months along with testosterone to establish a trend.  2. LUTS/ urinary frequency Continue flomax.  Consider addition of anticholinergic if void symptoms worsen. Behavioral modification for nocturia discussed.  3. Erectile dysfunction Samples of Viagra 100 mg given today along with instructions for use. I would like him to start with a half tab and see how it goes. Side effects discussed in detail. He will call us and let us know if he likes to fill this prescription.  Return in about 3 months (around 01/31/2016) for PSA/ testerone (ideally prior to visit).   Hollice Espy, MD  [facility] [address] [place], [place] [address] [phone]

## 2015-10-31 NOTE — Progress Notes (Signed)
Lupron IM Injection   Due to Prostate Cancer patient is present today for a Lupron Injection.  Medication: Lupron 6 month Dose: 45 mg  Location: left upper outer buttocks Lot: [id number] Exp: 01/22/2017  Patient tolerated well, no complications were noted  Performed by: Fonnie Jarvis, CMA  Follow up: 51months

## 2015-11-03 ENCOUNTER — Encounter: Payer: Self-pay | Admitting: Urology

## 2015-11-10 ENCOUNTER — Other Ambulatory Visit: Payer: Self-pay | Admitting: Family Medicine

## 2015-11-10 DIAGNOSIS — M158 Other polyosteoarthritis: Secondary | ICD-10-CM

## 2015-11-10 NOTE — Telephone Encounter (Signed)
Please call in tramadol.  

## 2015-12-05 ENCOUNTER — Other Ambulatory Visit: Payer: Self-pay | Admitting: Family Medicine

## 2015-12-05 DIAGNOSIS — I10 Essential (primary) hypertension: Secondary | ICD-10-CM

## 2015-12-08 NOTE — Telephone Encounter (Signed)
Dr. Fisher's pt.   Thanks,   -Laura  

## 2015-12-10 ENCOUNTER — Other Ambulatory Visit: Payer: Self-pay | Admitting: Family Medicine

## 2015-12-10 DIAGNOSIS — I2510 Atherosclerotic heart disease of native coronary artery without angina pectoris: Secondary | ICD-10-CM

## 2015-12-10 DIAGNOSIS — I10 Essential (primary) hypertension: Secondary | ICD-10-CM

## 2015-12-10 NOTE — Telephone Encounter (Signed)
Dr. Fisher's pt.   Thanks,   -Jamika Sadek  

## 2016-01-18 ENCOUNTER — Other Ambulatory Visit: Payer: Self-pay | Admitting: Family Medicine

## 2016-01-18 DIAGNOSIS — I10 Essential (primary) hypertension: Secondary | ICD-10-CM

## 2016-01-22 ENCOUNTER — Other Ambulatory Visit: Payer: Self-pay

## 2016-01-22 DIAGNOSIS — C61 Malignant neoplasm of prostate: Secondary | ICD-10-CM

## 2016-01-23 ENCOUNTER — Other Ambulatory Visit: Payer: Medicare PPO

## 2016-01-30 ENCOUNTER — Other Ambulatory Visit: Payer: Medicare PPO

## 2016-01-30 ENCOUNTER — Ambulatory Visit: Payer: Medicare PPO | Admitting: Urology

## 2016-01-30 DIAGNOSIS — C61 Malignant neoplasm of prostate: Secondary | ICD-10-CM

## 2016-01-31 LAB — TESTOSTERONE: Testosterone: 10 ng/dL — ABNORMAL LOW (ref 264–916)

## 2016-01-31 LAB — PSA: Prostate Specific Ag, Serum: 0.9 ng/mL (ref 0.0–4.0)

## 2016-02-03 ENCOUNTER — Ambulatory Visit (INDEPENDENT_AMBULATORY_CARE_PROVIDER_SITE_OTHER): Payer: Medicare PPO | Admitting: Urology

## 2016-02-03 ENCOUNTER — Encounter: Payer: Self-pay | Admitting: Urology

## 2016-02-03 VITALS — BP 146/76 | HR 76 | Ht 70.0 in | Wt 220.0 lb

## 2016-02-03 DIAGNOSIS — R399 Unspecified symptoms and signs involving the genitourinary system: Secondary | ICD-10-CM | POA: Diagnosis not present

## 2016-02-03 DIAGNOSIS — C61 Malignant neoplasm of prostate: Secondary | ICD-10-CM

## 2016-02-03 DIAGNOSIS — N529 Male erectile dysfunction, unspecified: Secondary | ICD-10-CM

## 2016-02-03 DIAGNOSIS — R351 Nocturia: Secondary | ICD-10-CM

## 2016-02-03 MED ORDER — SILDENAFIL CITRATE 20 MG PO TABS: 20.0000 mg | ORAL_TABLET | ORAL | 11 refills | Status: AC | PRN

## 2016-02-03 NOTE — Progress Notes (Signed)
3:46 PM  02/03/16   Kathryne Eriksson Rentz January 31, 1941 [id number]  Referring provider: Margarita Rana, MD No address on file  Chief Complaint  Patient presents with  . Prostate Cancer    18month    HPI:  75 year old male with T1c Gleason 4+4, 3+5 prostate cancer, PSA of 100 dx by Dr. Elnoria Howard on 10/2014 s/p EBRT with Dr. Massie Maroon.  Most recent PSA 0.9, testosterone 10 On 01/30/2016 which is down from 1.36  months ago. His PSA has not ever been undetectable despite Lupron.  Metastatic work up at time of dx including bone scan and CT abd/ pelvis without obvious metastatic diease but osteophyte formation int he 9-10th ribs and prominent pelvic LN but not diagnostic.    He was started on Firmagon by Dr. Elnoria Howard and transitioned to Lupron, last injection 10/2015 for 6 month depo.  Hot flashes are improving. He reports that he is not taking calcium and vitamin D as previously instructed.  He does have nocturia, average 3x times nightly.  He prescribed Flomax by Dr. Baruch Gouty last month which has helped somewhat but is not longer taking it.   He denies a significant daytime symptoms. He does snore especially when he sleeps on his back. He often wakes up tired.  He is a newly wed and at last visit, he was given a trial of Viagra. He has tried this twice using 50 mg with some improvement but continues to have difficulty maintaining erection. No side effects from the medication. He denies any issues with interest in libido.    PMH: Past Medical History:  Diagnosis Date  . Acid reflux   . Anemia   . Arteriosclerosis of coronary artery 06/25/2014   Overview:  S/p subendocardial MI, PCI and stent placement with cypher stent to right coronary artery and obtuse marginal 2 03/2008.   Cath 03/2008 showed 20 & 25% lad, 30% obtuse marginal 1, 30% left circumflex and stent in obtuse marginal 2 and right coronary artery.   acute STEMI with infererior wall hypokenesis and PCI stent placement of right coronary artery.  2014.    . Arthritis   . Benign essential HTN 06/25/2014  . CAD (coronary artery disease)   . Cardiomyopathy, ischemic 07/23/2014   Overview:  inferior hypokinesis ef 40%   . Dermatitis, eczematoid 10/17/2014  . Dizziness   . Eczema   . Heart attack   . Hematochezia   . Hyperglycemia   . Hyperlipidemia   . Hypertension   . Myocardial infarction   . Neck mass    Right side  . Osteoarthritis 10/18/2014  . Prostate cancer ([place]) 10/2014    Surgical History: Past Surgical History:  Procedure Laterality Date  . CARDIAC SURGERY     MIX2, stents placed  . CORONARY STENT PLACEMENT     RCA    Home Medications:    Medication List       Accurate as of 02/03/16  3:46 PM. Always use your most recent med list.          amLODipine 10 MG tablet Commonly known as:  NORVASC TAKE 1 TABLET BY MOUTH DAILY   aspirin EC 81 MG tablet Take 81 mg by mouth daily.   atorvastatin 40 MG tablet Commonly known as:  LIPITOR TAKE 1 TABLET BY MOUTH DAILY   hydrochlorothiazide 12.5 MG capsule Commonly known as:  MICROZIDE TAKE ONE CAPSULE BY MOUTH EVERY DAY   lisinopril 20 MG tablet Commonly known as:  PRINIVIL,ZESTRIL Take 20 mg by  mouth daily.   metoprolol succinate 25 MG 24 hr tablet Commonly known as:  TOPROL-XL TAKE 1 TABLET BY MOUTH DAILY   sildenafil 20 MG tablet Commonly known as:  REVATIO Take 1 tablet (20 mg total) by mouth as needed. Take 1-5 tabs as needed prior to intercourse   sucralfate 1 g tablet Commonly known as:  CARAFATE Take 1 tablet (1 g total) by mouth 3 (three) times daily before meals.   tamsulosin 0.4 MG Caps capsule Commonly known as:  FLOMAX Take 1 capsule (0.4 mg total) by mouth daily after supper.   traMADol 50 MG tablet Commonly known as:  ULTRAM TAKE 1 TABLET BY MOUTH EVERY 6 HOURS AS NEEDED       Allergies: No Known Allergies  Family History: Family History  Problem Relation Age of Onset  . Cancer Mother   . Kidney disease Neg Hx   .  Prostate cancer Neg Hx     Social History:  reports that he quit smoking about 53 years ago. His smoking use included Cigarettes. He quit after 3.00 years of use. He has never used smokeless tobacco. He reports that he does not drink alcohol or use drugs.   Physical Exam: BP (!) 146/76   Pulse 76   Ht 5\' 10"  (1.778 m)   Wt 220 lb (99.8 kg)   BMI 31.57 kg/m   Constitutional:  Alert and oriented, No acute distress.  Accompanied by wife today. HEENT: [place] AT, moist mucus membranes.  Trachea midline, no masses. Cardiovascular: No clubbing, cyanosis, or edema. Respiratory: Normal respiratory effort, no increased work of breathing. GI: Abdomen is soft, nontender, nondistended, no abdominal masses  Skin: No rashes, bruises or suspicious lesions. Neurologic: Grossly intact, no focal deficits, moving all 4 extremities. Psychiatric: Normal mood and affect.  Laboratory Data: Lab Results  Component Value Date   WBC 4.5 06/19/2015   HGB 10.8 (L) 04/02/2015   HCT 30.9 (L) 06/19/2015   MCV 82 06/19/2015   PLT 354 06/19/2015    Lab Results  Component Value Date   CREATININE 0.85 06/19/2015   Component     Latest Ref Rng & Units 10/18/2014 10/01/2015 01/30/2016  PSA     0.0 - 4.0 ng/mL 99.6 (H) 1.36 0.9   Component     Latest Ref Rng & Units 01/30/2016  Testosterone     264 - 916 ng/dL 10 (L)   Assessment & Plan:   1. Prostate cancer ([place]) High risk prostate cancers/p IMRT Recommend 2-3 years of Lupron for high-risk cancer Lupron injection x 6 months last visit, due in 3 months Would expect undetectable PSA on Lupron, however, PSA is not rising (actually decreased) which is reassuring. He is at castrate levels. We will continue to follow this as I suspect he has micrometastatic disease and is at risk for development of castrate resistent prostate cancer. Repeat PSA in 3 months. Discussed importance of bone health on ADT, recommend 1000-1200 mg daily calcium suppliment and [phone] IU  vit D daily.  Also encouraged weight being exercises and cardiovascular health.  2. LUTS/ nocturia Resume flomax.   Behavioral modification for nocturia discussed- advised patient to stop drinking beverages after supper time Given that the patient snores, is not rested, his primary nocturnal symptoms, I have recommended that he consider pursuing a sleep study. His wife has sleep apnea and is suspicious that he does as well.  He would like a referral to an ENT provider for further evaluation.   3. Erectile  dysfunction Device increased dose 200 mg Patient is interested in locally compounded Viagra, 20 mg tablets from [facility], prescription is sent today  Return in about 3 months (around 05/05/2016) for PSA, lupron (please check date).   Hollice Espy, MD  [facility] [address] [place], [place] [address] [phone]

## 2016-02-03 NOTE — Patient Instructions (Signed)
Discussed importance of bone health on ADT, recommend 1000-1200 mg daily calcium suppliment and 800-1000 IU vit D daily.  Also encouraged weight being exercises and cardiovascular health.  

## 2016-02-10 ENCOUNTER — Encounter: Payer: Self-pay | Admitting: Physician Assistant

## 2016-02-10 ENCOUNTER — Ambulatory Visit (INDEPENDENT_AMBULATORY_CARE_PROVIDER_SITE_OTHER): Payer: Medicare PPO | Admitting: Physician Assistant

## 2016-02-10 ENCOUNTER — Telehealth: Payer: Self-pay

## 2016-02-10 ENCOUNTER — Ambulatory Visit
Admission: RE | Admit: 2016-02-10 | Discharge: 2016-02-10 | Disposition: A | Discharge status: Home / Self Care | Payer: Medicare PPO | Source: Ambulatory Visit | Attending: Physician Assistant | Admitting: Physician Assistant

## 2016-02-10 VITALS — BP 122/70 | HR 84 | Temp 98.9°F | Resp 16 | Wt 220.0 lb

## 2016-02-10 DIAGNOSIS — J181 Lobar pneumonia, unspecified organism: Secondary | ICD-10-CM

## 2016-02-10 DIAGNOSIS — R05 Cough: Secondary | ICD-10-CM | POA: Insufficient documentation

## 2016-02-10 DIAGNOSIS — J984 Other disorders of lung: Secondary | ICD-10-CM | POA: Diagnosis not present

## 2016-02-10 DIAGNOSIS — R059 Cough, unspecified: Secondary | ICD-10-CM

## 2016-02-10 DIAGNOSIS — J189 Pneumonia, unspecified organism: Secondary | ICD-10-CM

## 2016-02-10 MED ORDER — BENZONATATE 100 MG PO CAPS: 100.0000 mg | ORAL_CAPSULE | Freq: Three times a day (TID) | ORAL | 0 refills | Status: DC

## 2016-02-10 MED ORDER — LEVOFLOXACIN 500 MG PO TABS: 500.0000 mg | ORAL_TABLET | Freq: Every day | ORAL | 0 refills | Status: DC

## 2016-02-10 NOTE — Telephone Encounter (Signed)
Unable to reach patient at this time voicemail box is full. KW 

## 2016-02-10 NOTE — Telephone Encounter (Signed)
-----   Message from Trinna Post, [place] sent at 02/10/2016 11:50 AM EST ----- CXR does show findings concerning for pneumonia. Will start patient on levofloxacin, one pill daily for seven days. There is a risk of achilles tendon rupture with this medicine, but patient has prostate cancer, a comorbidity requiring more broad based antibiotic. Please have patient schedule follow up visit in one week. He is also to get a CXR in one month. I have scheduled this, he does not need an Ov, only to get the image.

## 2016-02-10 NOTE — Progress Notes (Signed)
[place]  Chief Complaint  Patient presents with  . URI    Cough has been chronic; at least a few months.  . Sinusitis    Sinus pain/pressure for a few weeks.     Subjective:    Patient ID: Bradley Chaney, male    DOB: [birth year], 75 y.o.   MRN: [id number]  Upper Respiratory Infection: Bradley Chaney is a 75 y.o. male with a past medical history significant for Hypertension and Prostate Cancer complaining of symptoms of a URI, possible sinusitis. Symptoms include congestion, cough and fever. The cough had begun in August and is intermittent. It is unchanged since that time. He also c/o congestion, cough described as productive, lightheadedness, nasal congestion and productive cough with  white colored sputum for the past Several  weeks . He has not measured a fever, but has felt warm to the touch. He is nauseas without vomiting. He reports "sinus congestion" only when coughing. He is drinking plenty of fluids. Evaluation to date: none. Treatment to date: cough suppressants and decongestants. The treatment has provided minimal relief. Denies acid reflux or allergies. Denies PND.  Previous smoker in high school, quit. Some pain in head when coughing a lot.   Review of Systems  Constitutional: Positive for fatigue and fever (Pt reports he may have had a fever over the weekend.  He did not take his temperature.). Negative for activity change, appetite change, chills, diaphoresis and unexpected weight change.  HENT: Positive for congestion, rhinorrhea, sinus pain and sinus pressure. Negative for ear discharge, ear pain, hearing loss, nosebleeds, postnasal drip, sneezing, sore throat, tinnitus and trouble swallowing.   Eyes: Negative.   Respiratory: Positive for cough, chest tightness, shortness of breath and wheezing. Negative for apnea, choking and stridor.   Gastrointestinal: Positive for nausea (Occasional nausea). Negative for abdominal distention,  abdominal pain, anal bleeding, blood in stool, constipation, diarrhea, rectal pain and vomiting.  Musculoskeletal: Negative for neck pain and neck stiffness.  Neurological: Positive for light-headedness and headaches. Negative for dizziness.       Objective:   BP 122/70 (BP Location: Left Arm, Patient Position: Sitting, Cuff Size: Large)   Pulse 84   Temp 98.9 F (37.2 C) (Oral)   Resp 16   Wt 220 lb (99.8 kg)   BMI 31.57 kg/m   Patient Active Problem List   Diagnosis Date Noted  . Prostate cancer ([place]) 11/19/2014  . Knee pain 10/18/2014  . Osteoarthritis 10/18/2014  . Absolute anemia 10/17/2014  . Dermatitis, eczematoid 10/17/2014  . Blood glucose elevated 10/17/2014  . Cardiomyopathy, ischemic 07/23/2014  . Benign essential HTN 06/25/2014  . 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 Arteriosclerosis of coronary artery 06/25/2014  . Breathlessness on exertion 06/25/2014    Outpatient Encounter Prescriptions as of 02/10/2016  Medication Sig Note  . amLODipine (NORVASC) 10 MG tablet TAKE 1 TABLET BY MOUTH DAILY   . aspirin EC 81 MG tablet Take 81 mg by mouth daily.   Marland Kitchen atorvastatin (LIPITOR) 40 MG tablet TAKE 1 TABLET BY MOUTH DAILY   . hydrochlorothiazide (MICROZIDE) 12.5 MG capsule TAKE ONE CAPSULE BY MOUTH EVERY DAY   . lisinopril (PRINIVIL,ZESTRIL) 20 MG tablet Take 20 mg by mouth daily.  01/02/2015: Received from: External Pharmacy  . metoprolol succinate (TOPROL-XL) 25 MG 24 hr tablet TAKE 1 TABLET BY MOUTH DAILY   . sucralfate (CARAFATE) 1 g tablet Take 1 tablet (1 g total) by mouth 3 (three) times  daily before meals.   . tamsulosin (FLOMAX) 0.4 MG CAPS capsule Take 1 capsule (0.4 mg total) by mouth daily after supper.   . traMADol (ULTRAM) 50 MG tablet TAKE 1 TABLET BY MOUTH EVERY 6 HOURS AS NEEDED   . benzonatate (TESSALON PERLES) 100 MG capsule Take 1 capsule (100 mg total) by mouth 3 (three) times daily.   Marland Kitchen levofloxacin (LEVAQUIN) 500 MG  tablet Take 1 tablet (500 mg total) by mouth daily.   . sildenafil (REVATIO) 20 MG tablet Take 1 tablet (20 mg total) by mouth as needed. Take 1-5 tabs as needed prior to intercourse    No facility-administered encounter medications on file as of 02/10/2016.     No Known Allergies     Physical Exam  Constitutional: He is oriented to person, place, and time. He appears well-developed and well-nourished. No distress.  Patient coughing in office.   HENT:  Right Ear: Tympanic membrane and external ear normal.  Left Ear: Tympanic membrane and external ear normal.  Nose: No rhinorrhea. Right sinus exhibits no maxillary sinus tenderness and no frontal sinus tenderness. Left sinus exhibits no maxillary sinus tenderness and no frontal sinus tenderness.  Mouth/Throat: Uvula is midline, oropharynx is clear and moist and mucous membranes are normal. No oropharyngeal exudate.  Eyes: Conjunctivae are normal. Right eye exhibits no discharge. Left eye exhibits no discharge.  Watery Discharge   Neck: Normal range of motion. Neck supple.  Cardiovascular: Normal rate and regular rhythm.   Pulmonary/Chest: Effort normal and breath sounds normal. No respiratory distress. He has no wheezes. He has no rales.  Lymphadenopathy:    He has no cervical adenopathy.  Neurological: He is alert and oriented to person, place, and time.  Skin: Skin is warm and dry. He is not diaphoretic.  Psychiatric: He has a normal mood and affect. His behavior is normal.       Assessment & Plan:   Problem List Items Addressed This Visit    None    Visit Diagnoses    Pneumonia of left lower lobe due to infectious organism ([place])    -  Primary   Relevant Medications   benzonatate (TESSALON PERLES) 100 MG capsule   levofloxacin (LEVAQUIN) 500 MG tablet   Other Relevant Orders   DG Chest 2 View   Cough       Relevant Medications   benzonatate (TESSALON PERLES) 100 MG capsule   Other Relevant Orders   DG Chest 2 View  (Completed)     CXR showed left lower lobar consolidation concerning for pneumonia. Will treat with levofloxacin as above. Using levofloxacin vs. Azithro.doxy 2/2 patient's prostate cancer. Will have patient follow up in clinic in one week and get follow up CXR in one month.  Recommend rest, fluids, frequent hand washing.   Return in about 1 week (around 02/17/2016).  Patient Instructions  Community-Acquired Pneumonia, Adult Introduction Pneumonia is an infection of the lungs. One type of pneumonia can happen while a person is in a hospital. A different type can happen when a person is not in a hospital (community-acquired pneumonia). It is easy for this kind to spread from person to person. It can spread to you if you breathe near an infected person who coughs or sneezes. Some symptoms include:  A dry cough.  A wet (productive) cough.  Fever.  Sweating.  Chest pain. Follow these instructions at home:  Take over-the-counter and prescription medicines only as told by your doctor.  Only take cough medicine  if you are losing sleep.  If you were prescribed an antibiotic medicine, take it as told by your doctor. Do not stop taking the antibiotic even if you start to feel better.  Sleep with your head and neck raised (elevated). You can do this by putting a few pillows under your head, or you can sleep in a recliner.  Do not use tobacco products. These include cigarettes, chewing tobacco, and e-cigarettes. If you need help quitting, ask your doctor.  Drink enough water to keep your pee (urine) clear or pale yellow. A shot (vaccine) can help prevent pneumonia. Shots are often suggested for:  People older than 75 years of age.  People older than 75 years of age:  Who are having cancer treatment.  Who have long-term (chronic) lung disease.  Who have problems with their body's defense system (immune system). You may also prevent pneumonia if you take these actions:  Get the flu  (influenza) shot every year.  Go to the dentist as often as told.  Wash your hands often. If soap and water are not available, use hand sanitizer. Contact a doctor if:  You have a fever.  You lose sleep because your cough medicine does not help. Get help right away if:  You are short of breath and it gets worse.  You have more chest pain.  Your sickness gets worse. This is very serious if:  You are an older adult.  Your body's defense system is weak.  You cough up blood. This information is not intended to replace advice given to you by your health care provider. Make sure you discuss any questions you have with your health care provider. Document Released: 08/18/2007 Document Revised: 08/07/2015 Document Reviewed: 06/26/2014  2017 Elsevier     The entirety of the information documented in the History of Present Illness, Review of Systems and Physical Exam were personally obtained by me. Portions of this information were initially documented by Ashley Royalty, CMA and reviewed by me for thoroughness and accuracy.

## 2016-02-10 NOTE — Patient Instructions (Signed)
Community-Acquired Pneumonia, Adult °Introduction °Pneumonia is an infection of the lungs. One type of pneumonia can happen while a person is in a hospital. A different type can happen when a person is not in a hospital (community-acquired pneumonia). It is easy for this kind to spread from person to person. It can spread to you if you breathe near an infected person who coughs or sneezes. Some symptoms include: °· A dry cough. °· A wet (productive) cough. °· Fever. °· Sweating. °· Chest pain. °Follow these instructions at home: °· Take over-the-counter and prescription medicines only as told by your doctor. °¨ Only take cough medicine if you are losing sleep. °¨ If you were prescribed an antibiotic medicine, take it as told by your doctor. Do not stop taking the antibiotic even if you start to feel better. °· Sleep with your head and neck raised (elevated). You can do this by putting a few pillows under your head, or you can sleep in a recliner. °· Do not use tobacco products. These include cigarettes, chewing tobacco, and e-cigarettes. If you need help quitting, ask your doctor. °· Drink enough water to keep your pee (urine) clear or pale yellow. °A shot (vaccine) can help prevent pneumonia. Shots are often suggested for: °· People older than 75 years of age. °· People older than 75 years of age: °¨ Who are having cancer treatment. °¨ Who have long-term (chronic) lung disease. °¨ Who have problems with their body's defense system (immune system). °You may also prevent pneumonia if you take these actions: °· Get the flu (influenza) shot every year. °· Go to the dentist as often as told. °· Wash your hands often. If soap and water are not available, use hand sanitizer. °Contact a doctor if: °· You have a fever. °· You lose sleep because your cough medicine does not help. °Get help right away if: °· You are short of breath and it gets worse. °· You have more chest pain. °· Your sickness gets worse. This is very  serious if: °¨ You are an older adult. °¨ Your body's defense system is weak. °· You cough up blood. °This information is not intended to replace advice given to you by your health care provider. Make sure you discuss any questions you have with your health care provider. °Document Released: 08/18/2007 Document Revised: 08/07/2015 Document Reviewed: 06/26/2014 °© 2017 Elsevier ° °

## 2016-02-13 NOTE — Telephone Encounter (Signed)
Called no answer and voicemail is full-aa

## 2016-02-19 NOTE — Telephone Encounter (Signed)
Tried calling but the mailbox is full.   Thanks,   -Laura  

## 2016-02-27 NOTE — Telephone Encounter (Signed)
Lmtcb, -aa

## 2016-03-01 NOTE — Telephone Encounter (Signed)
LMTCB

## 2016-03-03 NOTE — Telephone Encounter (Signed)
LMTCB

## 2016-04-07 ENCOUNTER — Inpatient Hospital Stay: Payer: Medicare PPO | Attending: Radiation Oncology

## 2016-04-07 DIAGNOSIS — C61 Malignant neoplasm of prostate: Secondary | ICD-10-CM | POA: Insufficient documentation

## 2016-04-07 LAB — PSA: PSA: 0.62 ng/mL (ref 0.00–4.00)

## 2016-04-14 ENCOUNTER — Ambulatory Visit: Payer: Medicare PPO | Admitting: Radiation Oncology

## 2016-04-21 ENCOUNTER — Encounter: Payer: Self-pay | Admitting: Radiation Oncology

## 2016-04-21 ENCOUNTER — Ambulatory Visit
Admission: RE | Admit: 2016-04-21 | Discharge: 2016-04-21 | Disposition: A | Discharge status: Home / Self Care | Payer: Medicare PPO | Source: Ambulatory Visit | Attending: Radiation Oncology | Admitting: Radiation Oncology

## 2016-04-21 ENCOUNTER — Other Ambulatory Visit: Payer: Self-pay | Admitting: *Deleted

## 2016-04-21 VITALS — BP 169/88 | HR 72 | Temp 97.2°F | Resp 18 | Wt 216.6 lb

## 2016-04-21 DIAGNOSIS — N529 Male erectile dysfunction, unspecified: Secondary | ICD-10-CM | POA: Diagnosis not present

## 2016-04-21 DIAGNOSIS — C61 Malignant neoplasm of prostate: Secondary | ICD-10-CM | POA: Diagnosis present

## 2016-04-21 DIAGNOSIS — R6882 Decreased libido: Secondary | ICD-10-CM | POA: Diagnosis not present

## 2016-04-21 DIAGNOSIS — Z923 Personal history of irradiation: Secondary | ICD-10-CM | POA: Insufficient documentation

## 2016-04-21 DIAGNOSIS — R351 Nocturia: Secondary | ICD-10-CM | POA: Insufficient documentation

## 2016-04-21 MED ORDER — VARDENAFIL HCL 20 MG PO TABS: 20.0000 mg | ORAL_TABLET | Freq: Every day | ORAL | 4 refills | Status: DC | PRN

## 2016-04-21 NOTE — Progress Notes (Signed)
Radiation Oncology Follow up Note  Name: Bradley Chaney   Date:   04/21/2016 MRN:  [id number] DOB: [birth year]    This 75 y.o. 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  REFERRING PROVIDER: Margarita Rana, MD  HPI: Patient is a 76 year old male now out 10 months having completed IM RT radiation therapy to both his prostate and pelvic nodes for Gleason 8 (4+4) adenocarcinoma presenting the PSA of 100. He continues on Lupron suppression. His most recent PSA was 0.6 to January 24. He's been having some nocturia and frequency at night I've instructed him to start taking his Flomax after dinner since he has been taking in the morning. He's also having some erectile dysfunction and decreased libido which may be from his Lupron therapy..  COMPLICATIONS OF TREATMENT: none  FOLLOW UP COMPLIANCE: keeps appointments   PHYSICAL EXAM:  BP (!) 169/88   Pulse 72   Temp 97.2 F (36.2 C)   Resp 18   Wt 216 lb 9.6 oz (98.2 kg)   BMI 31.08 kg/m  On rectal exam rectal sphincter tone is good. Prostate is smooth contracted without evidence of nodularity or mass. Sulcus is preserved bilaterally. No discrete nodularity is identified. No other rectal abnormalities are noted. Well-developed well-nourished patient in NAD. HEENT reveals PERLA, EOMI, discs not visualized.  Oral cavity is clear. No oral mucosal lesions are identified. Neck is clear without evidence of cervical or supraclavicular adenopathy. Lungs are clear to A&P. Cardiac examination is essentially unremarkable with regular rate and rhythm without murmur rub or thrill. Abdomen is benign with no organomegaly or masses noted. Motor sensory and DTR levels are equal and symmetric in the upper and lower extremities. Cranial nerves II through XII are grossly intact. Proprioception is intact. No peripheral adenopathy or edema is identified. No motor or sensory  levels are noted. Crude visual fields are within normal range.  RADIOLOGY RESULTS: No current films for review  PLAN: At present time patient is under good biochemical control of his prostate cancer. He will remain on Lupron therapy for another year at least. I've also starting him on Levitra for his erectile dysfunction. I'm asking him to take 5 mg twice a week for the another month to see if we can strengthen some of his erections. Patient also will change his Flomax to a half an hour after dinner. I have asked to see him back in 1 year for follow-up. He continues on Lupron suppression.  I would like to take this opportunity to thank you for allowing me to participate in the care of your patient.Armstead Peaks., MD

## 2016-05-12 ENCOUNTER — Encounter: Payer: Self-pay | Admitting: Urology

## 2016-05-12 ENCOUNTER — Ambulatory Visit: Payer: Medicare PPO | Admitting: Urology

## 2016-06-09 ENCOUNTER — Telehealth: Payer: Self-pay | Admitting: Family Medicine

## 2016-06-09 NOTE — Telephone Encounter (Signed)
Called Pt to schedule AWV with [facility] - knb °

## 2016-06-19 LAB — COLOGUARD

## 2016-06-23 ENCOUNTER — Encounter: Payer: Self-pay | Admitting: Family Medicine

## 2016-06-30 ENCOUNTER — Ambulatory Visit: Payer: Medicare PPO | Admitting: Urology

## 2016-07-13 ENCOUNTER — Encounter: Payer: Self-pay | Admitting: Urology

## 2016-07-13 ENCOUNTER — Ambulatory Visit: Payer: Medicare PPO | Admitting: Urology

## 2016-07-13 VITALS — BP 148/70 | HR 83 | Ht 70.0 in | Wt 225.0 lb

## 2016-07-13 DIAGNOSIS — N529 Male erectile dysfunction, unspecified: Secondary | ICD-10-CM | POA: Diagnosis not present

## 2016-07-13 DIAGNOSIS — C61 Malignant neoplasm of prostate: Secondary | ICD-10-CM

## 2016-07-13 DIAGNOSIS — R351 Nocturia: Secondary | ICD-10-CM | POA: Diagnosis not present

## 2016-07-13 MED ORDER — LEUPROLIDE ACETATE (6 MONTH) 45 MG IM KIT
45.0000 mg | PACK | Freq: Once | INTRAMUSCULAR | Status: AC
  Administered 2016-07-13: 45 mg via INTRAMUSCULAR

## 2016-07-13 NOTE — Progress Notes (Signed)
3:04 PM  07/13/16   Roberts 1940-07-16 [id number]  Referring provider: Birdie Sons, MD [address] [facility] [place], [place] [address]  Chief Complaint  Patient presents with  . Prostate Cancer    HPI:  76 year-old male with T1c Gleason 4+4, 3+5 prostate cancer, PSA of 100 dx by Dr. Elnoria Howard on 10/2014 s/p EBRT with Dr. Massie Maroon.  Most recent PSA 0.9, testosterone 10 On 01/30/2016 which is down from 1.36  months ago. His PSA has not ever been undetectable despite Lupron.  Metastatic work up at time of dx including bone scan and CT abd/ pelvis without obvious metastatic diease but osteophyte formation int he 9-10th ribs and prominent pelvic LN but not diagnostic.    He was started on Firmagon by Dr. Elnoria Howard and transitioned to Lupron, last injection 10/2015 for 6 month depo.  He is over 2 again today for his next injection. This is been an issue in the past. Hot flashes are improving but persist. He complains today about low libido and subcutaneous nodules his Firmagon injection site.      He does have nocturia, average 3x times nightly which is stable.  He denies a significant daytime symptoms.   She also has a history of right pelvis function and has been using Viagra as needed. Due to his decreased libido, he's not been using this regularly.  PMH: Past Medical History:  Diagnosis Date  . Acid reflux   . Anemia   . Arteriosclerosis of coronary artery 06/25/2014   Overview:  S/p subendocardial MI, PCI and stent placement with cypher stent to right coronary artery and obtuse marginal 2 03/2008.   Cath 03/2008 showed 20 & 25% lad, 30% obtuse marginal 1, 30% left circumflex and stent in obtuse marginal 2 and right coronary artery.   acute STEMI with infererior wall hypokenesis and PCI stent placement of right coronary artery. 2014.    . Arthritis   . Benign essential HTN 06/25/2014  . CAD (coronary artery disease)   . Cardiomyopathy, ischemic 07/23/2014   Overview:  inferior  hypokinesis ef 40%   . Dermatitis, eczematoid 10/17/2014  . Dizziness   . Eczema   . Heart attack ([place])   . Hematochezia   . Hyperglycemia   . Hyperlipidemia   . Hypertension   . Myocardial infarction ([place])   . Neck mass    Right side  . Osteoarthritis 10/18/2014  . Prostate cancer ([place]) 10/2014    Surgical History: Past Surgical History:  Procedure Laterality Date  . CARDIAC SURGERY     MIX2, stents placed  . CORONARY STENT PLACEMENT     RCA    Home Medications:  Allergies as of 07/13/2016   No Known Allergies     Medication List       Accurate as of 07/13/16  3:04 PM. Always use your most recent med list.          amLODipine 10 MG tablet Commonly known as:  NORVASC TAKE 1 TABLET BY MOUTH DAILY   aspirin EC 81 MG tablet Take 81 mg by mouth daily.   atorvastatin 40 MG tablet Commonly known as:  LIPITOR TAKE 1 TABLET BY MOUTH DAILY   benzonatate 100 MG capsule Commonly known as:  TESSALON PERLES Take 1 capsule (100 mg total) by mouth 3 (three) times daily.   hydrochlorothiazide 12.5 MG capsule Commonly known as:  MICROZIDE TAKE ONE CAPSULE BY MOUTH EVERY DAY   levofloxacin 500 MG tablet Commonly  known as:  LEVAQUIN Take 1 tablet (500 mg total) by mouth daily.   lisinopril 20 MG tablet Commonly known as:  PRINIVIL,ZESTRIL Take 20 mg by mouth daily.   metoprolol succinate 25 MG 24 hr tablet Commonly known as:  TOPROL-XL TAKE 1 TABLET BY MOUTH DAILY   sildenafil 20 MG tablet Commonly known as:  REVATIO Take 1 tablet (20 mg total) by mouth as needed. Take 1-5 tabs as needed prior to intercourse   sucralfate 1 g tablet Commonly known as:  CARAFATE Take 1 tablet (1 g total) by mouth 3 (three) times daily before meals.   tamsulosin 0.4 MG Caps capsule Commonly known as:  FLOMAX Take 1 capsule (0.4 mg total) by mouth daily after supper.   traMADol 50 MG tablet Commonly known as:  ULTRAM TAKE 1 TABLET BY MOUTH EVERY 6 HOURS AS NEEDED   vardenafil 20  MG tablet Commonly known as:  LEVITRA Take 1 tablet (20 mg total) by mouth daily as needed for erectile dysfunction.       Allergies: No Known Allergies  Family History: Family History  Problem Relation Age of Onset  . Cancer Mother   . Kidney disease Neg Hx   . Prostate cancer Neg Hx     Social History:  reports that he quit smoking about 53 years ago. His smoking use included Cigarettes. He quit after 3.00 years of use. He has never used smokeless tobacco. He reports that he does not drink alcohol or use drugs.   Physical Exam: BP (!) 148/70 (BP Location: Right Arm, Patient Position: Sitting, Cuff Size: Normal)   Pulse 83   Ht 5\' 10"  (1.778 m)   Wt 225 lb (102.1 kg)   BMI 32.28 kg/m   Constitutional:  Alert and oriented, No acute distress.  Accompanied by wife today. HEENT: [place] AT, moist mucus membranes.  Trachea midline, no masses. Cardiovascular: No clubbing, cyanosis, or edema. Respiratory: Normal respiratory effort, no increased work of breathing. GI: Abdomen is soft, nontender, nondistended, no abdominal masses  Skin: No rashes, bruises or suspicious lesions. Neurologic: Grossly intact, no focal deficits, moving all 4 extremities. Psychiatric: Normal mood and affect.  Laboratory Data: Lab Results  Component Value Date   WBC 4.5 06/19/2015   HGB 10.8 (L) 04/02/2015   HCT 30.9 (L) 06/19/2015   MCV 82 06/19/2015   PLT 354 06/19/2015    Lab Results  Component Value Date   CREATININE 0.85 06/19/2015   Component     Latest Ref Rng & Units 10/18/2014 10/01/2015 01/30/2016 04/07/2016  PSA     0.00 - 4.00 ng/mL 99.6 (H) 1.36 0.9 0.62    Component     Latest Ref Rng & Units 01/30/2016  Testosterone     264 - 916 ng/dL 10 (L)   Assessment & Plan:   1. Prostate cancer ([place]) High risk prostate cancers/p IMRT Recommend 2-3 years of Lupron for high-risk cancer Lupron injection x 6 months  Importance of on time follow-up was stressed today Being followed by  cardiology PSA today, fairly high risk for recurrence and development of castrate resistance  2. LUTS/ nocturia Continue flomax.   Symptoms stable  3. Erectile dysfunction Use Viagra 100 mg prn- works moderately well   Return in about 6 months (around 01/13/2017) for PSA/ Lupron.   Hollice Espy, MD  [facility] [address] [place], [place] [address] [phone]

## 2016-07-13 NOTE — Progress Notes (Signed)
Lupron IM Injection   Due to Prostate Cancer patient is present today for a Lupron Injection.  Medication: Lupron 6 month Dose: 45 mg  Location: right upper outer buttocks Lot: [id number] Exp: 11/15/2017  Patient tolerated well, no complications were noted  Performed by: Fonnie Jarvis, CMA  Follow up: 62month w/PSA and Lupron

## 2016-07-14 ENCOUNTER — Telehealth: Payer: Self-pay

## 2016-07-14 LAB — PSA: PROSTATE SPECIFIC AG, SERUM: 0.6 ng/mL (ref 0.0–4.0)

## 2016-07-14 NOTE — Telephone Encounter (Signed)
-----   Message from Hollice Espy, MD sent at 07/14/2016  8:02 AM EDT ----- PSA stable, please let him know.  Hollice Espy, MD

## 2016-07-14 NOTE — Telephone Encounter (Signed)
LMOM

## 2016-07-15 NOTE — Telephone Encounter (Signed)
LMOM

## 2016-07-16 NOTE — Telephone Encounter (Signed)
LMOM- will send a letter.  

## 2016-08-12 ENCOUNTER — Telehealth: Payer: Self-pay | Admitting: Family Medicine

## 2016-08-23 ENCOUNTER — Telehealth: Payer: Self-pay | Admitting: Family Medicine

## 2016-09-27 ENCOUNTER — Telehealth: Payer: Self-pay | Admitting: Family Medicine

## 2016-09-27 NOTE — Telephone Encounter (Signed)
Left message about need to schedule AWV °

## 2016-10-20 ENCOUNTER — Ambulatory Visit (INDEPENDENT_AMBULATORY_CARE_PROVIDER_SITE_OTHER): Payer: Medicare PPO

## 2016-10-20 VITALS — BP 158/82 | HR 68 | Temp 98.9°F | Ht 70.0 in | Wt 213.4 lb

## 2016-10-20 DIAGNOSIS — Z1211 Encounter for screening for malignant neoplasm of colon: Secondary | ICD-10-CM

## 2016-10-20 DIAGNOSIS — Z0000 Encounter for general adult medical examination without abnormal findings: Secondary | ICD-10-CM

## 2016-10-20 NOTE — Progress Notes (Signed)
Subjective:   Bradley Chaney is a 76 y.o. male who presents for Medicare Annual/Subsequent preventive examination.  Review of Systems:  N/A  Cardiac Risk Factors include: advanced age (>44men, >4 women);dyslipidemia;hypertension;male gender;obesity (BMI >30kg/m2)     Objective:    Vitals: BP (!) 158/82 (BP Location: Left Arm)   Pulse 68   Temp 98.9 F (37.2 C) (Oral)   Ht 5\' 10"  (1.778 m)   Wt 213 lb 6.4 oz (96.8 kg)   BMI 30.62 kg/m   Body mass index is 30.62 kg/m.  Tobacco History  Smoking Status  . Former Smoker  . Years: 3.00  . Types: Cigarettes  . Quit date: 09/10/1962  Smokeless Tobacco  . Never Used     Counseling given: Not Answered   Past Medical History:  Diagnosis Date  . Acid reflux   . Anemia   . Arteriosclerosis of coronary artery 06/25/2014   Overview:  S/p subendocardial MI, PCI and stent placement with cypher stent to right coronary artery and obtuse marginal 2 03/2008.   Cath 03/2008 showed 20 & 25% lad, 30% obtuse marginal 1, 30% left circumflex and stent in obtuse marginal 2 and right coronary artery.   acute STEMI with infererior wall hypokenesis and PCI stent placement of right coronary artery. 2014.    . Arthritis   . Benign essential HTN 06/25/2014  . CAD (coronary artery disease)   . Cardiomyopathy, ischemic 07/23/2014   Overview:  inferior hypokinesis ef 40%   . Dermatitis, eczematoid 10/17/2014  . Dizziness   . Eczema   . Heart attack ([place])   . Hematochezia   . Hyperglycemia   . Hyperlipidemia   . Hypertension   . Myocardial infarction ([place])   . Neck mass    Right side  . Osteoarthritis 10/18/2014  . Prostate cancer ([place]) 10/2014   Past Surgical History:  Procedure Laterality Date  . CARDIAC SURGERY     MIX2, stents placed  . CORONARY STENT PLACEMENT     RCA   Family History  Problem Relation Age of Onset  . Cancer Mother   . Kidney disease Neg Hx   . Prostate cancer Neg Hx    History  Sexual Activity  . Sexual activity:  Not on file    Outpatient Encounter Prescriptions as of 10/20/2016  Medication Sig  . amLODipine (NORVASC) 10 MG tablet TAKE 1 TABLET BY MOUTH DAILY  . aspirin EC 81 MG tablet Take 81 mg by mouth daily.  Marland Kitchen atorvastatin (LIPITOR) 40 MG tablet TAKE 1 TABLET BY MOUTH DAILY  . hydrochlorothiazide (MICROZIDE) 12.5 MG capsule TAKE ONE CAPSULE BY MOUTH EVERY DAY  . lisinopril (PRINIVIL,ZESTRIL) 20 MG tablet Take 20 mg by mouth daily.   . metoprolol succinate (TOPROL-XL) 25 MG 24 hr tablet TAKE 1 TABLET BY MOUTH DAILY  . sildenafil (REVATIO) 20 MG tablet Take 1 tablet (20 mg total) by mouth as needed. Take 1-5 tabs as needed prior to intercourse  . traMADol (ULTRAM) 50 MG tablet TAKE 1 TABLET BY MOUTH EVERY 6 HOURS AS NEEDED  . [DISCONTINUED] benzonatate (TESSALON PERLES) 100 MG capsule Take 1 capsule (100 mg total) by mouth 3 (three) times daily. (Patient not taking: Reported on 04/21/2016)  . [DISCONTINUED] levofloxacin (LEVAQUIN) 500 MG tablet Take 1 tablet (500 mg total) by mouth daily. (Patient not taking: Reported on 04/21/2016)  . [DISCONTINUED] sucralfate (CARAFATE) 1 g tablet Take 1 tablet (1 g total) by mouth 3 (three) times daily before meals. (Patient not  taking: Reported on 07/13/2016)  . [DISCONTINUED] tamsulosin (FLOMAX) 0.4 MG CAPS capsule Take 1 capsule (0.4 mg total) by mouth daily after supper.  . [DISCONTINUED] vardenafil (LEVITRA) 20 MG tablet Take 1 tablet (20 mg total) by mouth daily as needed for erectile dysfunction. (Patient not taking: Reported on 07/13/2016)   No facility-administered encounter medications on file as of 10/20/2016.     Activities of Daily Living In your present state of health, do you have any difficulty performing the following activities: 10/20/2016  Hearing? Y  Vision? Y  Difficulty concentrating or making decisions? N  Walking or climbing stairs? Y  Comment knee pain  Dressing or bathing? N  Doing errands, shopping? N  Preparing Food and eating ? N  Using  the Toilet? N  In the past six months, have you accidently leaked urine? N  Do you have problems with loss of bowel control? N  Managing your Medications? N  Managing your Finances? N  Housekeeping or managing your Housekeeping? N  Some recent data might be hidden    Patient Care Team: Birdie Sons, MD as PCP - General (Family Medicine) Noreene Filbert, MD as Referring Physician (Radiation Oncology) Corey Skains, MD as Consulting Physician (Cardiology) Hollice Espy, MD as Consulting Physician (Urology)   Assessment:     Exercise Activities and Dietary recommendations Current Exercise Habits: The patient does not participate in regular exercise at present, Exercise limited by: None identified  Goals    . Exercise 150 minutes per week (moderate activity)    . Increase water intake          Recommend increasing water intake to 4-6 glasses a day.       Fall Risk Fall Risk  10/20/2016 04/21/2016 07/04/2015 06/19/2015 01/13/2015  Falls in the past year? No No No No No   Depression Screen PHQ 2/9 Scores 10/20/2016 04/21/2016 07/04/2015 06/19/2015  PHQ - 2 Score 0 0 0 0    Cognitive Function- Pt declined screening today.        Immunization History  Administered Date(s) Administered  . Pneumococcal Conjugate-13 06/18/2014  . Pneumococcal Polysaccharide-23 06/19/2015   Screening Tests Health Maintenance  Topic Date Due  . COLONOSCOPY  02/24/1991  . INFLUENZA VACCINE  10/13/2016  . TETANUS/TDAP  03/15/2026 (Originally 02/24/1960)  . PNA vac Low Risk Adult  Completed      Plan:  I have personally reviewed and addressed the Medicare Annual Wellness questionnaire and have noted the following in the patient's chart:  A. Medical and social history B. Use of alcohol, tobacco or illicit drugs  C. Current medications and supplements D. Functional ability and status E.  Nutritional status F.  Physical activity G. Advance directives H. List of other physicians I.    Hospitalizations, surgeries, and ER visits in previous 12 months J.  [place] such as hearing and vision if needed, cognitive and depression L. Referrals and appointments - none  In addition, I have reviewed and discussed with patient certain preventive protocols, quality metrics, and best practice recommendations. A written personalized care plan for preventive services as well as general preventive health recommendations were provided to patient.  See attached scanned questionnaire for additional information.   Signed,  Fabio Neighbors, LPN Nurse Health Advisor   MD Recommendations:

## 2016-10-20 NOTE — Patient Instructions (Signed)
[name] , Thank you for taking time to come for your Medicare Wellness Visit. I appreciate your ongoing commitment to your health goals. Please review the following plan we discussed and let me know if I can assist you in the future.   Screening recommendations/referrals: Colonoscopy: cologuard ordered today Recommended yearly ophthalmology/optometry visit for glaucoma screening and checkup Recommended yearly dental visit for hygiene and checkup  Vaccinations: Influenza vaccine: due fall 2018 Pneumococcal vaccine: completed series Tdap vaccine: declined Shingles vaccine: declined  Advanced directives: Advance directive discussed with you today. I have provided a copy for you to complete at home and have notarized. Once this is complete please bring a copy in to our office so we can scan it into your chart.  Conditions/risks identified: Obesity; Recommend increasing water intake to 4-6 glasses a day.   Next appointment: None, need to schedule a physical with PCP and 1 year AWV.  Preventive Care 33 Years and Older, Male Preventive care refers to lifestyle choices and visits with your health care provider that can promote health and wellness. What does preventive care include?  A yearly physical exam. This is also called an annual well check.  Dental exams once or twice a year.  Routine eye exams. Ask your health care provider how often you should have your eyes checked.  Personal lifestyle choices, including:  Daily care of your teeth and gums.  Regular physical activity.  Eating a healthy diet.  Avoiding tobacco and drug use.  Limiting alcohol use.  Practicing safe sex.  Taking low doses of aspirin every day.  Taking vitamin and mineral supplements as recommended by your health care provider. What happens during an annual well check? The services and screenings done by your health care provider during your annual well check will depend on your age, overall health,  lifestyle risk factors, and family history of disease. Counseling  Your health care provider may ask you questions about your:  Alcohol use.  Tobacco use.  Drug use.  Emotional well-being.  Home and relationship well-being.  Sexual activity.  Eating habits.  History of falls.  Memory and ability to understand (cognition).  Work and work Statistician. Screening  You may have the following tests or measurements:  Height, weight, and BMI.  Blood pressure.  Lipid and cholesterol levels. These may be checked every 5 years, or more frequently if you are over 4 years old.  Skin check.  Lung cancer screening. You may have this screening every year starting at age 17 if you have a 30-pack-year history of smoking and currently smoke or have quit within the past 15 years.  Fecal occult blood test (FOBT) of the stool. You may have this test every year starting at age 28.  Flexible sigmoidoscopy or colonoscopy. You may have a sigmoidoscopy every 5 years or a colonoscopy every 10 years starting at age 40.  Prostate cancer screening. Recommendations will vary depending on your family history and other risks.  Hepatitis C blood test.  Hepatitis B blood test.  Sexually transmitted disease (STD) testing.  Diabetes screening. This is done by checking your blood sugar (glucose) after you have not eaten for a while (fasting). You may have this done every 1-3 years.  Abdominal aortic aneurysm (AAA) screening. You may need this if you are a current or former smoker.  Osteoporosis. You may be screened starting at age 36 if you are at high risk. Talk with your health care provider about your test results, treatment options, and  if necessary, the need for more tests. Vaccines  Your health care provider may recommend certain vaccines, such as:  Influenza vaccine. This is recommended every year.  Tetanus, diphtheria, and acellular pertussis (Tdap, Td) vaccine. You may need a Td booster  every 10 years.  Zoster vaccine. You may need this after age 68.  Pneumococcal 13-valent conjugate (PCV13) vaccine. One dose is recommended after age 71.  Pneumococcal polysaccharide (PPSV23) vaccine. One dose is recommended after age 26. Talk to your health care provider about which screenings and vaccines you need and how often you need them. This information is not intended to replace advice given to you by your health care provider. Make sure you discuss any questions you have with your health care provider. Document Released: 03/28/2015 Document Revised: 11/19/2015 Document Reviewed: 12/31/2014 Elsevier Interactive Patient Education  2017 [place] Prevention in the Home Falls can cause injuries. They can happen to people of all ages. There are many things you can do to make your home safe and to help prevent falls. What can I do on the outside of my home?  Regularly fix the edges of walkways and driveways and fix any cracks.  Remove anything that might make you trip as you walk through a door, such as a raised step or threshold.  Trim any bushes or trees on the path to your home.  Use bright outdoor lighting.  Clear any walking paths of anything that might make someone trip, such as rocks or tools.  Regularly check to see if handrails are loose or broken. Make sure that both sides of any steps have handrails.  Any raised decks and porches should have guardrails on the edges.  Have any leaves, snow, or ice cleared regularly.  Use sand or salt on walking paths during winter.  Clean up any spills in your garage right away. This includes oil or grease spills. What can I do in the bathroom?  Use night lights.  Install grab bars by the toilet and in the tub and shower. Do not use towel bars as grab bars.  Use non-skid mats or decals in the tub or shower.  If you need to sit down in the shower, use a plastic, non-slip stool.  Keep the floor dry. Clean up any  water that spills on the floor as soon as it happens.  Remove soap buildup in the tub or shower regularly.  Attach bath mats securely with double-sided non-slip rug tape.  Do not have throw rugs and other things on the floor that can make you trip. What can I do in the bedroom?  Use night lights.  Make sure that you have a light by your bed that is easy to reach.  Do not use any sheets or blankets that are too big for your bed. They should not hang down onto the floor.  Have a firm chair that has side arms. You can use this for support while you get dressed.  Do not have throw rugs and other things on the floor that can make you trip. What can I do in the kitchen?  Clean up any spills right away.  Avoid walking on wet floors.  Keep items that you use a lot in easy-to-reach places.  If you need to reach something above you, use a strong step stool that has a grab bar.  Keep electrical cords out of the way.  Do not use floor polish or wax that makes floors slippery. If you  must use wax, use non-skid floor wax.  Do not have throw rugs and other things on the floor that can make you trip. What can I do with my stairs?  Do not leave any items on the stairs.  Make sure that there are handrails on both sides of the stairs and use them. Fix handrails that are broken or loose. Make sure that handrails are as long as the stairways.  Check any carpeting to make sure that it is firmly attached to the stairs. Fix any carpet that is loose or worn.  Avoid having throw rugs at the top or bottom of the stairs. If you do have throw rugs, attach them to the floor with carpet tape.  Make sure that you have a light switch at the top of the stairs and the bottom of the stairs. If you do not have them, ask someone to add them for you. What else can I do to help prevent falls?  Wear shoes that:  Do not have high heels.  Have rubber bottoms.  Are comfortable and fit you well.  Are closed  at the toe. Do not wear sandals.  If you use a stepladder:  Make sure that it is fully opened. Do not climb a closed stepladder.  Make sure that both sides of the stepladder are locked into place.  Ask someone to hold it for you, if possible.  Clearly mark and make sure that you can see:  Any grab bars or handrails.  First and last steps.  Where the edge of each step is.  Use tools that help you move around (mobility aids) if they are needed. These include:  Canes.  Walkers.  Scooters.  Crutches.  Turn on the lights when you go into a dark area. Replace any light bulbs as soon as they burn out.  Set up your furniture so you have a clear path. Avoid moving your furniture around.  If any of your floors are uneven, fix them.  If there are any pets around you, be aware of where they are.  Review your medicines with your doctor. Some medicines can make you feel dizzy. This can increase your chance of falling. Ask your doctor what other things that you can do to help prevent falls. This information is not intended to replace advice given to you by your health care provider. Make sure you discuss any questions you have with your health care provider. Document Released: 12/26/2008 Document Revised: 08/07/2015 Document Reviewed: 04/05/2014 Elsevier Interactive Patient Education  2017 [employer].

## 2016-10-27 ENCOUNTER — Ambulatory Visit (INDEPENDENT_AMBULATORY_CARE_PROVIDER_SITE_OTHER): Payer: Medicare PPO | Admitting: Family Medicine

## 2016-10-27 ENCOUNTER — Encounter: Payer: Self-pay | Admitting: Family Medicine

## 2016-10-27 ENCOUNTER — Ambulatory Visit
Admission: RE | Admit: 2016-10-27 | Discharge: 2016-10-27 | Disposition: A | Discharge status: Home / Self Care | Payer: Medicare PPO | Source: Ambulatory Visit | Attending: Radiation Oncology | Admitting: Radiation Oncology

## 2016-10-27 ENCOUNTER — Encounter: Payer: Self-pay | Admitting: Radiation Oncology

## 2016-10-27 VITALS — BP 130/66 | HR 80 | Temp 96.6°F | Resp 20 | Wt 214.0 lb

## 2016-10-27 VITALS — BP 134/80 | HR 91 | Temp 98.0°F | Resp 16 | Ht 70.0 in | Wt 215.0 lb

## 2016-10-27 DIAGNOSIS — Z6830 Body mass index (BMI) 30.0-30.9, adult: Secondary | ICD-10-CM

## 2016-10-27 DIAGNOSIS — M25562 Pain in left knee: Secondary | ICD-10-CM

## 2016-10-27 DIAGNOSIS — M25561 Pain in right knee: Secondary | ICD-10-CM | POA: Diagnosis not present

## 2016-10-27 DIAGNOSIS — G8929 Other chronic pain: Secondary | ICD-10-CM | POA: Diagnosis not present

## 2016-10-27 DIAGNOSIS — I2510 Atherosclerotic heart disease of native coronary artery without angina pectoris: Secondary | ICD-10-CM

## 2016-10-27 DIAGNOSIS — Z0000 Encounter for general adult medical examination without abnormal findings: Secondary | ICD-10-CM | POA: Diagnosis not present

## 2016-10-27 DIAGNOSIS — C61 Malignant neoplasm of prostate: Secondary | ICD-10-CM | POA: Insufficient documentation

## 2016-10-27 DIAGNOSIS — R739 Hyperglycemia, unspecified: Secondary | ICD-10-CM | POA: Diagnosis not present

## 2016-10-27 DIAGNOSIS — I10 Essential (primary) hypertension: Secondary | ICD-10-CM | POA: Diagnosis not present

## 2016-10-27 DIAGNOSIS — Z923 Personal history of irradiation: Secondary | ICD-10-CM | POA: Insufficient documentation

## 2016-10-27 DIAGNOSIS — E782 Mixed hyperlipidemia: Secondary | ICD-10-CM

## 2016-10-27 NOTE — Progress Notes (Signed)
Patient: Bradley Chaney, Male    DOB: 08/28/1940, 76 y.o.   MRN: [id number] Visit Date: 10/27/2016  Today's Provider: Lelon Huh, MD   Chief Complaint  Patient presents with  . Annual Exam  . Hypertension  . Hyperlipidemia   Subjective:    Annual physical exam Bradley Chaney is a 76 y.o. male who presents today for health maintenance and complete physical. He feels fairly well. He reports exercising none. He reports he is sleeping well.  ----------------------------------------------------------------  Patient saw [facility] 10/20/2016 for AWV.    Hypertension, follow-up:  BP Readings from Last 3 Encounters:  10/27/16 134/80  10/20/16 (!) 158/82  07/13/16 (!) 148/70    He was last seen for hypertension 06/19/2015.   BP at that visit was 148/76. Management since that visit includes; followed by cardiology.He reports good compliance with treatment. He is not having side effects. none He is not exercising. He is adherent to low salt diet.   Outside blood pressures are not checking. He is experiencing none.  Patient denies none.   Cardiovascular risk factors include none.  Use of agents associated with hypertension: none.   ------------------------------------------------------------------------    Lipid/Cholesterol, Follow-up:   Last seen for this 06/19/2015.   Management since that visit includes; no changes.  Last Lipid Panel:    Component Value Date/Time   CHOL 197 06/19/2015 1109   TRIG 105 06/19/2015 1109   HDL 55 06/19/2015 1109   LDLCALC 121 (H) 06/19/2015 1109    He reports good compliance with treatment. He is not having side effects. none  Wt Readings from Last 3 Encounters:  10/27/16 215 lb (97.5 kg)  10/20/16 213 lb 6.4 oz (96.8 kg)  07/13/16 225 lb (102.1 kg)    ------------------------------------------------------------------------    From 06/19/2015-labs checked, no changes.  Blood glucose elevated From 06/19/2015-Hemoglobin  A1c checked 5.6. No changes.  Prostate cancer ([place]) From 06/19/2015-Stable. Patient advised to continue follow-up with urology and oncology.  Complains of daily bilateral  Knee pain, worse in right. Takes tramadol occasionally which helps for while. Had xrays 09-14-2014 showing severe bilateral arthritis.   Review of Systems  Musculoskeletal: Positive for arthralgias.       Knee pain x2 years  All other systems reviewed and are negative.   Social History      He  reports that he quit smoking about 54 years ago. His smoking use included Cigarettes. He quit after 3.00 years of use. He has never used smokeless tobacco. He reports that he does not drink alcohol or use drugs.       Social History   Social History  . Marital status: Widowed    Spouse name: N/A  . Number of children: 4  . Years of education: College   Occupational History  . Retired    Social History Main Topics  . Smoking status: Former Smoker    Years: 3.00    Types: Cigarettes    Quit date: 09/10/1962  . Smokeless tobacco: Never Used  . Alcohol use No  . Drug use: No  . Sexual activity: Not Asked   Other Topics Concern  . None   Social History Narrative  . None    Past Medical History:  Diagnosis Date  . Acid reflux   . Anemia   . Arteriosclerosis of coronary artery 06/25/2014   Overview:  S/p subendocardial MI, PCI and stent placement with cypher stent to right coronary artery and obtuse marginal 2 03/2008.  Cath 03/2008 showed 20 & 25% lad, 30% obtuse marginal 1, 30% left circumflex and stent in obtuse marginal 2 and right coronary artery.   acute STEMI with infererior wall hypokenesis and PCI stent placement of right coronary artery. 2014.    . Arthritis   . Benign essential HTN 06/25/2014  . CAD (coronary artery disease)   . Cardiomyopathy, ischemic 07/23/2014   Overview:  inferior hypokinesis ef 40%   . Dermatitis, eczematoid 10/17/2014  . Dizziness   . Eczema   . Heart attack ([place])   . Hematochezia    . Hyperglycemia   . Hyperlipidemia   . Hypertension   . Myocardial infarction ([place])   . Neck mass    Right side  . Osteoarthritis 10/18/2014  . Prostate cancer ([place]) 10/2014     Patient Active Problem List   Diagnosis Date Noted  . Prostate cancer ([place]) 11/19/2014  . Knee pain 10/18/2014  . Osteoarthritis 10/18/2014  . Absolute anemia 10/17/2014  . Dermatitis, eczematoid 10/17/2014  . Blood glucose elevated 10/17/2014  . Cardiomyopathy, ischemic 07/23/2014  . Benign essential HTN 06/25/2014  . 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 Arteriosclerosis of coronary artery 06/25/2014  . Breathlessness on exertion 06/25/2014    Past Surgical History:  Procedure Laterality Date  . CARDIAC SURGERY     MIX2, stents placed  . CORONARY STENT PLACEMENT     RCA    Family History        Family Status  Relation Status  . Mother Deceased at age 46       Cancer (Unknown type)  . Father Deceased at age 17       Died in war  . Brother Alive  . Neg Hx (Not Specified)        His family history includes Cancer in his mother.     No Known Allergies   Current Outpatient Prescriptions:  .  amLODipine (NORVASC) 10 MG tablet, TAKE 1 TABLET BY MOUTH DAILY, Disp: 90 tablet, Rfl: 1 .  aspirin EC 81 MG tablet, Take 81 mg by mouth daily., Disp: , Rfl:  .  atorvastatin (LIPITOR) 40 MG tablet, TAKE 1 TABLET BY MOUTH DAILY, Disp: 90 tablet, Rfl: 1 .  hydrochlorothiazide (MICROZIDE) 12.5 MG capsule, TAKE ONE CAPSULE BY MOUTH EVERY DAY, Disp: 90 capsule, Rfl: 2 .  lisinopril (PRINIVIL,ZESTRIL) 20 MG tablet, Take 20 mg by mouth daily. , Disp: , Rfl:  .  metoprolol succinate (TOPROL-XL) 25 MG 24 hr tablet, TAKE 1 TABLET BY MOUTH DAILY, Disp: 90 tablet, Rfl: 1 .  sildenafil (REVATIO) 20 MG tablet, Take 1 tablet (20 mg total) by mouth as needed. Take 1-5 tabs as needed prior to intercourse, Disp: 30 tablet, Rfl: 11 .  traMADol (ULTRAM) 50 MG tablet, TAKE 1 TABLET BY MOUTH  EVERY 6 HOURS AS NEEDED, Disp: 120 tablet, Rfl: 5   Patient Care Team: Birdie Sons, MD as PCP - General (Family Medicine) Noreene Filbert, MD as Referring Physician (Radiation Oncology) Corey Skains, MD as Consulting Physician (Cardiology) Hollice Espy, MD as Consulting Physician (Urology)      Objective:   Vitals: BP 134/80 (BP Location: Left Arm, Patient Position: Sitting, Cuff Size: Large)   Pulse 91   Temp 98 F (36.7 C) (Oral)   Resp 16   Ht 5\' 10"  (1.778 m)   Wt 215 lb (97.5 kg)   SpO2 97%   BMI 30.85 kg/m    Vitals:   10/27/16 0913  BP: 134/80  Pulse: 91  Resp: 16  Temp: 98 F (36.7 C)  TempSrc: Oral  SpO2: 97%  Weight: 215 lb (97.5 kg)  Height: 5\' 10"  (1.778 m)     Physical Exam   General Appearance:    Alert, cooperative, no distress, appears stated age  Head:    Normocephalic, without obvious abnormality, atraumatic  Eyes:    PERRL, conjunctiva/corneas clear, EOM's intact, fundi    benign, both eyes       Ears:    Normal TM's and external ear canals, both ears  Nose:   Nares normal, septum midline, mucosa normal, no drainage   or sinus tenderness  Throat:   Lips, mucosa, and tongue normal; teeth and gums normal  Neck:   Supple, symmetrical, trachea midline, no adenopathy;       thyroid:  No enlargement/tenderness/nodules; no carotid   bruit or JVD  Back:     Symmetric, no curvature, ROM normal, no CVA tenderness  Lungs:     Clear to auscultation bilaterally, respirations unlabored  Chest wall:    No tenderness or deformity  Heart:    Regular rate and rhythm, S1 and S2 normal, no murmur, rub   or gallop  Abdomen:     Soft, non-tender, bowel sounds active all four quadrants,    no masses, no organomegaly  Genitalia:    deferred  Rectal:    deferred  Extremities:   Extremities normal, atraumatic, no cyanosis or edema. Mild tenderness medial aspect of both knees.   Pulses:   2+ and symmetric all extremities  Skin:   Skin color, texture,  turgor normal, no rashes or lesions  Lymph nodes:   Cervical, supraclavicular, and axillary nodes normal  Neurologic:   CNII-XII intact. Normal strength, sensation and reflexes      throughout    Depression Screen PHQ 2/9 Scores 10/20/2016 04/21/2016 07/04/2015 06/19/2015  PHQ - 2 Score 0 0 0 0      Assessment & Plan:     Routine Health Maintenance and Physical Exam  Exercise Activities and Dietary recommendations Goals    . Exercise 150 minutes per week (moderate activity)    . Increase water intake          Recommend increasing water intake to 4-6 glasses a day.        Immunization History  Administered Date(s) Administered  . Pneumococcal Conjugate-13 06/18/2014  . Pneumococcal Polysaccharide-23 06/19/2015    Health Maintenance  Topic Date Due  . COLONOSCOPY  02/24/1991  . INFLUENZA VACCINE  10/13/2016  . TETANUS/TDAP  03/15/2026 (Originally 02/24/1960)  . PNA vac Low Risk Adult  Completed     Discussed health benefits of physical activity, and encouraged him to engage in regular exercise appropriate for his age and condition.    -------------------------------------------------------------------- 1. Annual physical exam Generally doing well. Cologuard has already been ordered which he is agreeable to.   2. Benign essential HTN Well controlled.  Continue current medications.  .  3. Arteriosclerosis of coronary artery Asymptomatic. Compliant with medication.  Continue aggressive risk factor modification.   - Comprehensive metabolic panel - Lipid panel  4. BMI 30.0-30.9,adult Diet and exercise.   5. Combined fat and carbohydrate induced hyperlipemia He is tolerating atorvastatin well with no adverse effects.   - Comprehensive metabolic panel - Lipid panel  6. Blood glucose elevated  - Hemoglobin A1c  7. Prostate cancer [facility]) Continue regular follow up with urology.   8. Chronic pain of both knees  -  AMB referral to orthopedics    Lelon Huh,  MD  [facility]

## 2016-10-27 NOTE — Progress Notes (Signed)
Radiation Oncology Follow up Note  Name: Bradley Chaney   Date:   10/27/2016 MRN:  [id number] DOB: Oct 11, 1940    This 76 y.o. 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  REFERRING PROVIDER: Birdie Sons, MD  HPI: Allayne Stack is a 76 year old male now at 14 months having completed IM RT radiation therapy for a.Gleason 8 (4+4) adenocarcinoma presenting the PSA of 100. He is seen today in routine follow-up and is doing well. His most recent PSA was 0.6 back in May 2018.his last Lupron injection was back in May for 6 month. He is having his ADT therapy performed in Dr. Cherrie Gauze office. He specifically denies any increasing lower urinary tract symptoms or diarrhea. He does complain of bilateral knee pain most likely arthritis. He is scheduled to see an orthopedic surgeon for that.  COMPLICATIONS OF TREATMENT: none  FOLLOW UP COMPLIANCE: keeps appointments   PHYSICAL EXAM:  BP 130/66   Pulse 80   Temp (!) 96.6 F (35.9 C)   Resp 20   Wt 213 lb 15.3 oz (97 kg)   BMI 30.70 kg/m  On rectal exam rectal sphincter tone is good. Prostate is smooth contracted without evidence of nodularity or mass. Sulcus is preserved bilaterally. No discrete nodularity is identified. No other rectal abnormalities are noted. Well-developed well-nourished patient in NAD. HEENT reveals PERLA, EOMI, discs not visualized.  Oral cavity is clear. No oral mucosal lesions are identified. Neck is clear without evidence of cervical or supraclavicular adenopathy. Lungs are clear to A&P. Cardiac examination is essentially unremarkable with regular rate and rhythm without murmur rub or thrill. Abdomen is benign with no organomegaly or masses noted. Motor sensory and DTR levels are equal and symmetric in the upper and lower extremities. Cranial nerves II through XII are grossly intact. Proprioception is intact. No peripheral adenopathy or edema is  identified. No motor or sensory levels are noted. Crude visual fields are within normal range.  RADIOLOGY RESULTS: no current films for review  PLAN: present time he is under good biochemical control his prostate cancer. He continues close follow-up care with urology and is receiving his Lupron injections there. I have asked to see him back in 1 year for follow-up. Patient is to call sooner with any concerns.  I would like to take this opportunity to thank you for allowing me to participate in the care of your patient.Armstead Peaks., MD

## 2016-10-27 NOTE — Patient Instructions (Signed)
   Please contact your eyecare professional to schedule a routine eye exam  

## 2016-10-28 LAB — LIPID PANEL
CHOL/HDL RATIO: 5.1 ratio — AB (ref 0.0–5.0)
Cholesterol, Total: 239 mg/dL — ABNORMAL HIGH (ref 100–199)
HDL: 47 mg/dL (ref >39)
LDL CALC: 169 mg/dL — AB (ref 0–99)
Triglycerides: 115 mg/dL (ref 0–149)
VLDL CHOLESTEROL CAL: 23 mg/dL (ref 5–40)

## 2016-10-28 LAB — COMPREHENSIVE METABOLIC PANEL
ALBUMIN: 3.9 g/dL (ref 3.5–4.8)
ALK PHOS: 80 IU/L (ref 39–117)
ALT: 4 IU/L (ref 0–44)
AST: 12 IU/L (ref 0–40)
Albumin/Globulin Ratio: 1.1 — ABNORMAL LOW (ref 1.2–2.2)
BUN / CREAT RATIO: 13 (ref 10–24)
BUN: 12 mg/dL (ref 8–27)
Bilirubin Total: 0.4 mg/dL (ref 0.0–1.2)
CALCIUM: 9.4 mg/dL (ref 8.6–10.2)
CO2: 25 mmol/L (ref 20–29)
CREATININE: 0.94 mg/dL (ref 0.76–1.27)
Chloride: 102 mmol/L (ref 96–106)
GFR, EST AFRICAN AMERICAN: 91 mL/min/{1.73_m2} (ref >59)
GFR, EST NON AFRICAN AMERICAN: 79 mL/min/{1.73_m2} (ref >59)
GLOBULIN, TOTAL: 3.6 g/dL (ref 1.5–4.5)
GLUCOSE: 110 mg/dL — AB (ref 65–99)
Potassium: 4.6 mmol/L (ref 3.5–5.2)
SODIUM: 143 mmol/L (ref 134–144)
TOTAL PROTEIN: 7.5 g/dL (ref 6.0–8.5)

## 2016-10-28 LAB — HEMOGLOBIN A1C
Est. average glucose Bld gHb Est-mCnc: 100 mg/dL
Hgb A1c MFr Bld: 5.1 % (ref 4.8–5.6)

## 2016-11-02 ENCOUNTER — Telehealth: Payer: Self-pay | Admitting: Family Medicine

## 2016-11-02 NOTE — Telephone Encounter (Signed)
Order for cologuard faxed to [facility] °

## 2016-11-04 ENCOUNTER — Telehealth: Payer: Self-pay | Admitting: Family Medicine

## 2016-11-04 NOTE — Telephone Encounter (Signed)
Pt is returning call.  CB#[phone]/MW

## 2016-11-04 NOTE — Telephone Encounter (Signed)
Patient advised of lab results from 10/27/2016

## 2017-01-26 ENCOUNTER — Ambulatory Visit: Payer: Medicare PPO | Admitting: Urology

## 2017-02-01 ENCOUNTER — Ambulatory Visit: Payer: Medicare PPO | Admitting: Urology

## 2017-02-01 ENCOUNTER — Encounter: Payer: Self-pay | Admitting: Urology

## 2017-02-01 VITALS — BP 154/83 | HR 92 | Ht 70.0 in | Wt 212.0 lb

## 2017-02-01 DIAGNOSIS — C61 Malignant neoplasm of prostate: Secondary | ICD-10-CM | POA: Diagnosis not present

## 2017-02-01 MED ORDER — LEUPROLIDE ACETATE (6 MONTH) 45 MG IM KIT
45.0000 mg | PACK | Freq: Once | INTRAMUSCULAR | Status: AC
  Administered 2017-02-01: 45 mg via INTRAMUSCULAR

## 2017-02-01 NOTE — Progress Notes (Signed)
Lupron IM Injection   Due to Prostate Cancer patient is present today for a Lupron Injection.  Medication: Lupron 6 month Dose: 45 mg  Location: left upper outer buttocks Lot: [id number] Exp: 04/30/2019  Patient tolerated well, no complications were noted  Performed by: Fonnie Jarvis, CMA  Follow up: 66month

## 2017-02-02 LAB — PSA: PROSTATE SPECIFIC AG, SERUM: 0.2 ng/mL (ref 0.0–4.0)

## 2017-02-07 NOTE — Progress Notes (Signed)
Patient was scheduled to be seen today in the clinic MD.  Due to physician emergency in the operating room, unable to be evaluated.  He was administered Lupron x 59months.  Follow-up appointment has been appropriately rescheduled.  Hollice Espy, MD

## 2017-02-08 ENCOUNTER — Ambulatory Visit (INDEPENDENT_AMBULATORY_CARE_PROVIDER_SITE_OTHER): Payer: Medicare PPO | Admitting: Urology

## 2017-02-08 VITALS — BP 193/96 | HR 91 | Ht 70.0 in | Wt 219.8 lb

## 2017-02-08 DIAGNOSIS — C61 Malignant neoplasm of prostate: Secondary | ICD-10-CM

## 2017-02-08 DIAGNOSIS — R399 Unspecified symptoms and signs involving the genitourinary system: Secondary | ICD-10-CM | POA: Diagnosis not present

## 2017-02-08 DIAGNOSIS — N529 Male erectile dysfunction, unspecified: Secondary | ICD-10-CM

## 2017-02-08 NOTE — Progress Notes (Signed)
3:03 PM  02/17/17   [place] 1940/04/05 [id number]  Referring provider: Birdie Sons, [place] [address] [facility], [place] [address]  Chief Complaint  Patient presents with  . Prostate Cancer    HPI:  76 year-old male with T1c Gleason 4+4, 3+5 prostate cancer, PSA of 100 dx by Dr. Elnoria Howard on 10/2014 s/p EBRT with Dr. Massie Maroon.  Most recent PSA 0.2 on 01/2017 which continues to trend downward.  His PSA is never been undetectable.  Metastatic work up at time of dx including bone scan and CT abd/ pelvis without obvious metastatic diease but osteophyte formation int he 9-10th ribs and prominent pelvic LN but not diagnostic.    He was started on Firmagon by Dr. Elnoria Howard and transitioned to Lupron.  Last injection performed a week ago in our office (6 month depo).  He continues to have hot flashes but these are less bothersome to him.  His libido is low.  She also has a history of ED function and has been using Viagra as needed. Due to his decreased libido, he's not been using this regularly.  No significant voiding symptoms today.  His nocturia has improved greatly.  PMH: Past Medical History:  Diagnosis Date  . Acid reflux   . Anemia   . Arteriosclerosis of coronary artery 06/25/2014   Overview:  S/p subendocardial MI, PCI and stent placement with cypher stent to right coronary artery and obtuse marginal 2 03/2008.   Cath 03/2008 showed 20 & 25% lad, 30% obtuse marginal 1, 30% left circumflex and stent in obtuse marginal 2 and right coronary artery.   acute STEMI with infererior wall hypokenesis and PCI stent placement of right coronary artery. 2014.    . Arthritis   . Benign essential HTN 06/25/2014  . CAD (coronary artery disease)   . Cardiomyopathy, ischemic 07/23/2014   Overview:  inferior hypokinesis ef 40%   . Dermatitis, eczematoid 10/17/2014  . Dizziness   . Eczema   . Hyperglycemia   . Hyperlipidemia   . Hypertension   . Myocardial infarction ([place])   .  Osteoarthritis 10/18/2014  . Prostate cancer ([place]) 10/2014    Surgical History: Past Surgical History:  Procedure Laterality Date  . CARDIAC SURGERY     MIX2, stents placed  . CORONARY STENT PLACEMENT     RCA    Home Medications:  Allergies as of 02/08/2017   No Known Allergies     Medication List        Accurate as of 02/08/17 11:59 PM. Always use your most recent med list.          amLODipine 10 MG tablet Commonly known as:  NORVASC TAKE 1 TABLET BY MOUTH DAILY   aspirin EC 81 MG tablet Take 81 mg by mouth daily.   atorvastatin 40 MG tablet Commonly known as:  LIPITOR TAKE 1 TABLET BY MOUTH DAILY   hydrochlorothiazide 12.5 MG capsule Commonly known as:  MICROZIDE TAKE ONE CAPSULE BY MOUTH EVERY DAY   lisinopril 20 MG tablet Commonly known as:  PRINIVIL,ZESTRIL Take 20 mg by mouth daily.   metoprolol succinate 25 MG 24 hr tablet Commonly known as:  TOPROL-XL TAKE 1 TABLET BY MOUTH DAILY   sildenafil 20 MG tablet Commonly known as:  REVATIO Take 1 tablet (20 mg total) by mouth as needed. Take 1-5 tabs as needed prior to intercourse   traMADol 50 MG tablet Commonly known as:  ULTRAM TAKE 1 TABLET BY MOUTH EVERY  6 HOURS AS NEEDED       Allergies: No Known Allergies  Family History: Family History  Problem Relation Age of Onset  . Cancer Mother   . Kidney disease Neg Hx   . Prostate cancer Neg Hx     Social History:  reports that he quit smoking about 54 years ago. His smoking use included cigarettes. He quit after 3.00 years of use. he has never used smokeless tobacco. He reports that he does not drink alcohol or use drugs.   ROS: UROLOGY Frequent Urination?: Yes Hard to postpone urination?: No Burning/pain with urination?: No Get up at night to urinate?: Yes Leakage of urine?: No Urine stream starts and stops?: No Trouble starting stream?: No Do you have to strain to urinate?: No Blood in urine?: No Urinary tract infection?: No Sexually  transmitted disease?: No Injury to kidneys or bladder?: No Painful intercourse?: No Weak stream?: No Erection problems?: No Penile pain?: No Gastrointestinal Nausea?: No Vomiting?: No Indigestion/heartburn?: No Diarrhea?: No Constipation?: No Constitutional Fever: No Night sweats?: Yes Weight loss?: Yes Fatigue?: Yes Skin Skin rash/lesions?: No Itching?: No Eyes Blurred vision?: Yes Double vision?: No Ears/Nose/Throat Sore throat?: No Sinus problems?: No Hematologic/Lymphatic Swollen glands?: No Easy bruising?: No Cardiovascular Leg swelling?: No Chest pain?: No Respiratory Cough?: No Shortness of breath?: No Endocrine Excessive thirst?: No Musculoskeletal Back pain?: No Joint pain?: No Neurological Headaches?: No Dizziness?: No Psychologic Depression?: No Anxiety?: No   Physical Exam: BP (!) 193/96 (BP Location: Right Arm, Patient Position: Sitting, Cuff Size: Large)   Pulse 91   Ht 5\' 10"  (1.778 m)   Wt 219 lb 12.8 oz (99.7 kg)   BMI 31.54 kg/m   Constitutional:  Alert and oriented, No acute distress. [place] HEENT: [place] AT, moist mucus membranes.  Trachea midline, no masses. Cardiovascular: No clubbing, cyanosis, or edema. Respiratory: Normal respiratory effort, no increased work of breathing. GI: Abdomen is soft, nontender, nondistended, no abdominal masses  Skin: No rashes, bruises or suspicious lesions. Neurologic: Grossly intact, no focal deficits, moving all 4 extremities. Psychiatric: Normal mood and affect.  Laboratory Data: Lab Results  Component Value Date   WBC 4.5 06/19/2015   HGB 10.9 (L) 06/19/2015   HCT 30.9 (L) 06/19/2015   MCV 82 06/19/2015   PLT 354 06/19/2015    Lab Results  Component Value Date   CREATININE 0.94 10/27/2016   Component     Latest Ref Rng & Units 10/18/2014 01/30/2016 07/13/2016 02/01/2017  Prostate Specific Ag, Serum     0.0 - 4.0 ng/mL 99.6 (H) 0.9 0.6 0.2    Component     Latest Ref Rng & Units 01/30/2016   Testosterone     264 - 916 ng/dL 10 (L)   Assessment & Plan:   1. Prostate cancer ([place]) High risk prostate cancers/p IMRT Recommend 2-3 years of Lupron for high-risk cancer Lupron injection x 6 months given last week  2. LUTS/ nocturia No longer taking flomax Improving  3. Erectile dysfunction Use Viagra 100 mg prn- works moderately well   Return in about 6 months (around 08/08/2017) for PSA.  Hollice Espy, MD  [facility] [address] [place], [place], [place] [address] [phone]

## 2017-02-17 ENCOUNTER — Ambulatory Visit
Admission: EM | Admit: 2017-02-17 | Discharge: 2017-02-17 | Discharge status: Against Medical Advice | Payer: Medicare PPO | Attending: Family Medicine | Admitting: Family Medicine

## 2017-02-17 ENCOUNTER — Encounter: Payer: Self-pay | Admitting: Family Medicine

## 2017-02-17 DIAGNOSIS — I2510 Atherosclerotic heart disease of native coronary artery without angina pectoris: Secondary | ICD-10-CM | POA: Diagnosis not present

## 2017-02-17 DIAGNOSIS — I161 Hypertensive emergency: Secondary | ICD-10-CM

## 2017-02-17 DIAGNOSIS — Z8546 Personal history of malignant neoplasm of prostate: Secondary | ICD-10-CM | POA: Diagnosis not present

## 2017-02-17 DIAGNOSIS — Z955 Presence of coronary angioplasty implant and graft: Secondary | ICD-10-CM | POA: Diagnosis not present

## 2017-02-17 DIAGNOSIS — E7801 Familial hypercholesterolemia: Secondary | ICD-10-CM | POA: Insufficient documentation

## 2017-02-17 DIAGNOSIS — M1990 Unspecified osteoarthritis, unspecified site: Secondary | ICD-10-CM | POA: Insufficient documentation

## 2017-02-17 DIAGNOSIS — I10 Essential (primary) hypertension: Secondary | ICD-10-CM | POA: Insufficient documentation

## 2017-02-17 DIAGNOSIS — R079 Chest pain, unspecified: Secondary | ICD-10-CM

## 2017-02-17 DIAGNOSIS — Z7982 Long term (current) use of aspirin: Secondary | ICD-10-CM | POA: Diagnosis not present

## 2017-02-17 DIAGNOSIS — Z79899 Other long term (current) drug therapy: Secondary | ICD-10-CM | POA: Insufficient documentation

## 2017-02-17 DIAGNOSIS — I255 Ischemic cardiomyopathy: Secondary | ICD-10-CM | POA: Insufficient documentation

## 2017-02-17 DIAGNOSIS — Z87891 Personal history of nicotine dependence: Secondary | ICD-10-CM | POA: Diagnosis not present

## 2017-02-17 DIAGNOSIS — I252 Old myocardial infarction: Secondary | ICD-10-CM | POA: Insufficient documentation

## 2017-02-17 MED ORDER — ASPIRIN 81 MG PO CHEW
243.0000 mg | CHEWABLE_TABLET | Freq: Once | ORAL | Status: AC
  Administered 2017-02-17: 243 mg via ORAL

## 2017-02-17 MED ORDER — NITROGLYCERIN 0.4 MG SL SUBL
0.4000 mg | SUBLINGUAL_TABLET | SUBLINGUAL | Status: DC | PRN
  Administered 2017-02-17: 0.4 mg via SUBLINGUAL

## 2017-02-17 NOTE — ED Provider Notes (Signed)
[facility]    CSN: [id number] Arrival date & time: 02/17/17  3419  History   Chief Complaint Chief Complaint  Patient presents with  . Chest Pain   HPI  76 year old male with known CAD status post MI x2 with PCI, ischemic cardiomyopathy, hypertension presents with chest pain.  Chest pain  Patient states that he had a brief bout of chest pain last night when he was out with his wife.  He states that he felt that as if he twisted the wrong way.  He states that it lasted seconds and then resolved.  Patient states that this morning he was taking his grandchildren to school.  He developed sudden onset central chest pain.  Described as pressure.  8/10 in severity.  No associated diaphoresis.  Patient does note shortness of breath.  He states that he is got some baseline shortness of breath.  No reports of radiation.  Seems to be worse with movement.  Relieved with rest.  He states that he has not taken his blood pressure medication this morning.  His blood pressure is markedly elevated.  He has not taken sildenafil recently.  He took aspirin 81 mg this morning.  Patient has no other symptoms at this time.  No other complaints.  Past Medical History:  Diagnosis Date  . Acid reflux   . Anemia   . Arteriosclerosis of coronary artery 06/25/2014   Overview:  S/p subendocardial MI, PCI and stent placement with cypher stent to right coronary artery and obtuse marginal 2 03/2008.   Cath 03/2008 showed 20 & 25% lad, 30% obtuse marginal 1, 30% left circumflex and stent in obtuse marginal 2 and right coronary artery.   acute STEMI with infererior wall hypokenesis and PCI stent placement of right coronary artery. 2014.    . Arthritis   . Benign essential HTN 06/25/2014  . CAD (coronary artery disease)   . Cardiomyopathy, ischemic 07/23/2014   Overview:  inferior hypokinesis ef 40%   . Dermatitis, eczematoid 10/17/2014  . Dizziness   . Eczema   . Hyperglycemia   . Hyperlipidemia     . Hypertension   . Myocardial infarction ([place])   . Osteoarthritis 10/18/2014  . Prostate cancer ([place]) 10/2014    Patient Active Problem List   Diagnosis Date Noted  . Prostate cancer ([place]) 11/19/2014  . Knee pain 10/18/2014  . Osteoarthritis 10/18/2014  . Absolute anemia 10/17/2014  . Dermatitis, eczematoid 10/17/2014  . Blood glucose elevated 10/17/2014  . Cardiomyopathy, ischemic 07/23/2014  . Benign essential HTN 06/25/2014  . 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 Arteriosclerosis of coronary artery 06/25/2014  . Breathlessness on exertion 06/25/2014    Past Surgical History:  Procedure Laterality Date  . CARDIAC SURGERY     MIX2, stents placed  . CORONARY STENT PLACEMENT     RCA     Home Medications    Prior to Admission medications   Medication Sig Start Date End Date Taking? Authorizing Provider  amLODipine (NORVASC) 10 MG tablet TAKE 1 TABLET BY MOUTH DAILY 12/10/15  Yes Mar Daring, PA-C  aspirin EC 81 MG tablet Take 81 mg by mouth daily.   Yes [provider]  atorvastatin (LIPITOR) 40 MG tablet TAKE 1 TABLET BY MOUTH DAILY 12/10/15  Yes Fenton Malling M, PA-C  hydrochlorothiazide (MICROZIDE) 12.5 MG capsule TAKE ONE CAPSULE BY MOUTH EVERY DAY 01/19/16  Yes Birdie Sons, MD  lisinopril (PRINIVIL,ZESTRIL) 20 MG tablet Take 20 mg by  mouth daily.  11/08/14  Yes [provider]  metoprolol succinate (TOPROL-XL) 25 MG 24 hr tablet TAKE 1 TABLET BY MOUTH DAILY 12/08/15  Yes Fenton Malling M, PA-C  sildenafil (REVATIO) 20 MG tablet Take 1 tablet (20 mg total) by mouth as needed. Take 1-5 tabs as needed prior to intercourse 02/03/16   Hollice Espy, MD  traMADol (ULTRAM) 50 MG tablet TAKE 1 TABLET BY MOUTH EVERY 6 HOURS AS NEEDED 11/10/15   Birdie Sons, MD    Family History Family History  Problem Relation Age of Onset  . Cancer Mother   . Kidney disease Neg Hx   . Prostate cancer Neg Hx     Social  History Social History   Tobacco Use  . Smoking status: Former Smoker    Years: 3.00    Types: Cigarettes    Last attempt to quit: 09/10/1962    Years since quitting: 54.4  . Smokeless tobacco: Never Used  Substance Use Topics  . Alcohol use: No    Alcohol/week: 0.0 oz  . Drug use: No    Allergies   Patient has no known allergies.   Review of Systems Review of Systems  Constitutional: Negative for diaphoresis.  Respiratory: Positive for shortness of breath.   Cardiovascular: Positive for chest pain.  Gastrointestinal: Negative.   All other systems reviewed and are negative.  Physical Exam Triage Vital Signs ED Triage Vitals [02/17/17 0957]  Enc Vitals Group     BP (!) 205/107     Pulse Rate (!) 101     Resp 16     Temp 98.4 F (36.9 C)     Temp Source Oral     SpO2 100 %     Weight      Height      Head Circumference      Peak Flow      Pain Score      Pain Loc      Pain Edu?      Excl. in [place]?    Updated Vital Signs BP (!) 205/107 (BP Location: Left Arm)   Pulse (!) 101   Temp 98.4 F (36.9 C) (Oral)   Resp 16   Ht 5\' 10"  (1.778 m)   Wt 219 lb (99.3 kg)   SpO2 100%   BMI 31.42 kg/m   Physical Exam  Constitutional: He is oriented to person, place, and time. He appears well-developed and well-nourished. He does not appear ill. No distress.  HENT:  Head: Normocephalic and atraumatic.  Nose: Nose normal.  Eyes: Conjunctivae are normal. No scleral icterus.  Cardiovascular: Regular rhythm.  Tachycardic.  No appreciable murmur.  Pulmonary/Chest: Effort normal and breath sounds normal. He has no wheezes. He has no rales.  Abdominal: Soft. He exhibits no distension. There is no tenderness. There is no rebound and no guarding.  Musculoskeletal: He exhibits no tenderness.  Trace to 1+ lower extremity edema bilaterally.  Neurological: He is alert and oriented to person, place, and time.  Speech normal.  Answers questions properly.  No apparent deficits.    Skin: Skin is warm. No rash noted.  Psychiatric: He has a normal mood and affect. His behavior is normal.  Vitals reviewed.  UC Treatments / Results  Labs (all labs ordered are listed, but only abnormal results are displayed) Labs Reviewed - No data to display  ED ECG REPORT   Date: 02/17/2017  EKG Time: 10:23 AM  Rate: 92  Rhythm: normal sinus rhythm, occasional  PVC.  Axis: Normal.  Intervals: Normal.  ST&T Change: No ST or T wave changes.  Narrative Interpretation: Sinus rhythm with occasional PVCs.  Probable LAE.  Radiology No results found.  Procedures Procedures (including critical care time)  Medications Ordered in UC Medications  nitroGLYCERIN (NITROSTAT) SL tablet 0.4 mg (0.4 mg Sublingual Given 02/17/17 1019)  aspirin chewable tablet 243 mg (243 mg Oral Given 02/17/17 1018)     Initial Impression / Assessment and Plan / UC Course  I have reviewed the triage vital signs and the nursing notes.  Pertinent labs & imaging results that were available during my care of the patient were reviewed by me and considered in my medical decision making (see chart for details).    76 year old male with a significant cardiac history presents with chest pain. EKG unremarkable. Aspirin and nitro given. Advised the patient that he needs to go via ambulance.  Patient is refusing.  Patient will be leaving with his wife (against medical advice).  Final Clinical Impressions(s) / UC Diagnoses   Final diagnoses:  Chest pain, unspecified type  Hypertensive emergency    ED Discharge Orders    None     Controlled Substance Prescriptions [place] Controlled Substance Registry consulted? Not Applicable   Coral Spikes, DO 02/17/17 1025

## 2017-02-17 NOTE — ED Triage Notes (Signed)
Pressure type chest pain , onset this am constant but 1 episode last night that resolved after a few minutes. Denies other symptoms. Pain worse with coughing. Hx of 2 previous MIs.

## 2017-02-18 ENCOUNTER — Telehealth: Payer: Self-pay | Admitting: Urology

## 2017-02-18 NOTE — Telephone Encounter (Signed)
App was made and I am mailing to the patient. It looks like he is scheduled in August of 2019 to have his PSA checked at the cancer center.   [name]

## 2017-02-18 NOTE — Telephone Encounter (Signed)
-----   Message from Hollice Espy, MD sent at 02/17/2017  3:04 PM EST ----- This patient left clinic last week without making 6 months f/u with PSA.  Please ensure that this gets scheduled!!!  Hollice Espy, MD

## 2017-03-02 NOTE — Telephone Encounter (Signed)
Visit complete.

## 2017-03-19 IMAGING — CT CT NECK W/ CM
2 of 3 series · 8 of 14 positions shown, 9 images · IV contrast (iopamidol)
Comparison: None.

CLINICAL DATA: Right submandibular nodule. Neck mass. Nonpainful.
Personal history of prostate cancer treated with radiation therapy
October 2014.

EXAM:
CT NECK WITH CONTRAST
TECHNIQUE: Multidetector CT imaging of the neck was performed using the
standard protocol following the bolus administration of intravenous
contrast.
CONTRAST:  75mL [id number] IOPAMIDOL ([id number]) INJECTION 61%

[Series 2: axial neck · axial · 0.56mm/px · z∈[-240,-70]mm · 4 of 143 slices shown]
[im 29/143  bone]
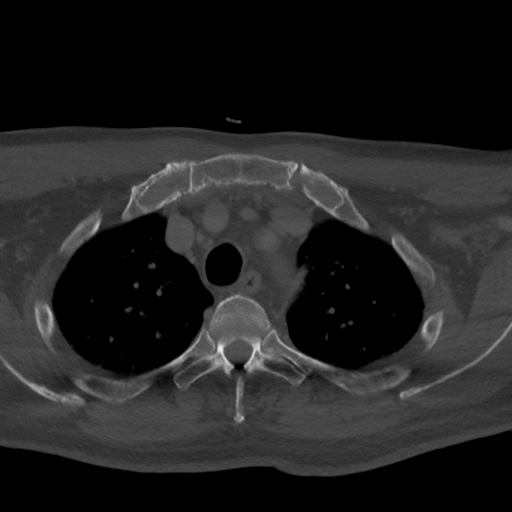
[im 57/143  bone]
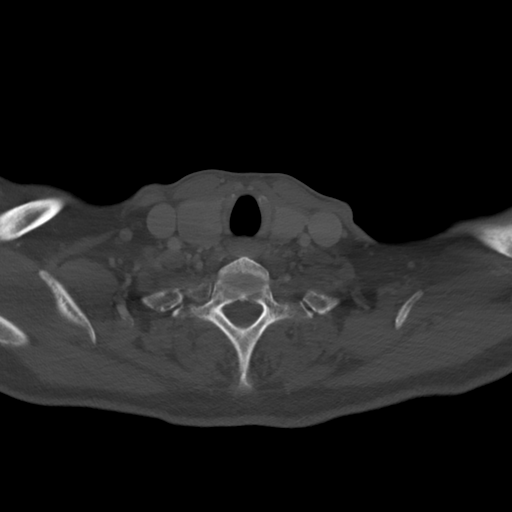
[im 86/143  bone]
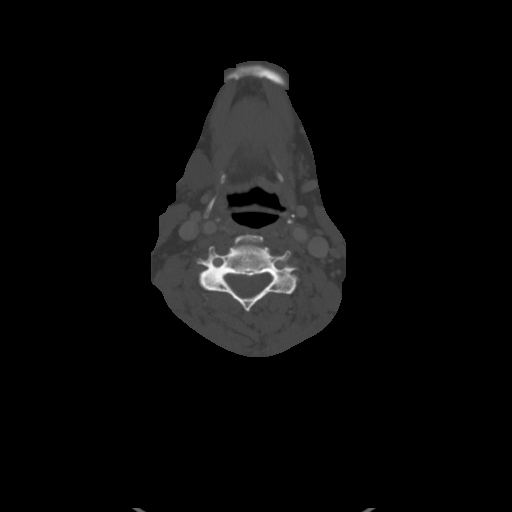
[im 114/143  bone]
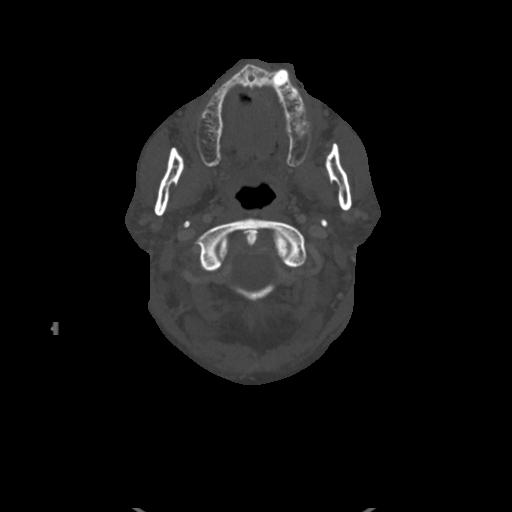

[Series 7: orthogonal ax · axial · 0.51mm/px · z∈[-271,-84]mm · 4 of 159 slices shown, 5 images]
[im 32/159  soft-tissue]
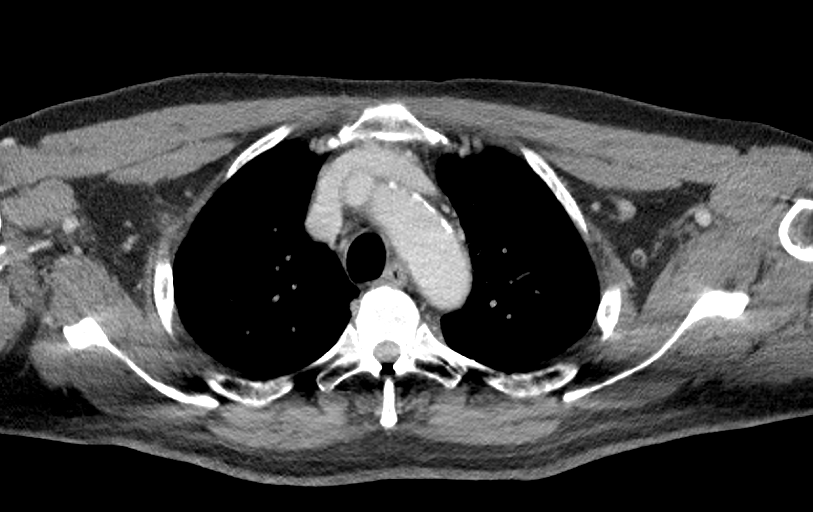
[im 32/159  bone]
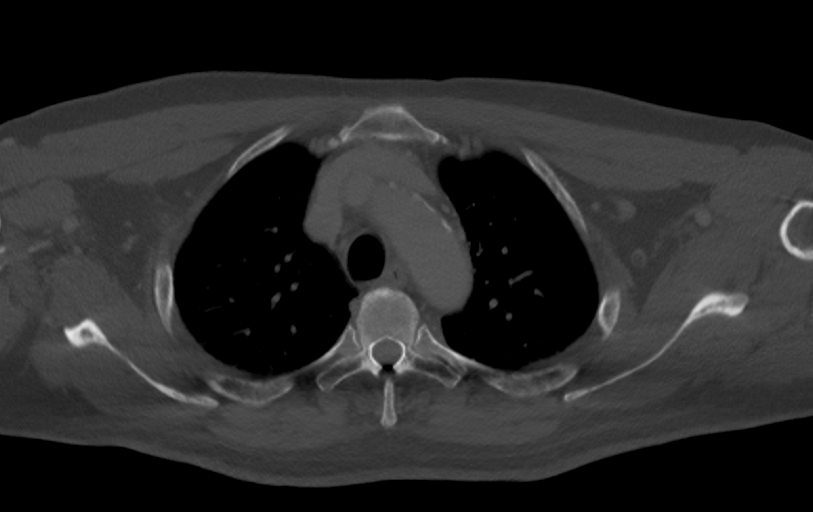
[im 64/159  bone]
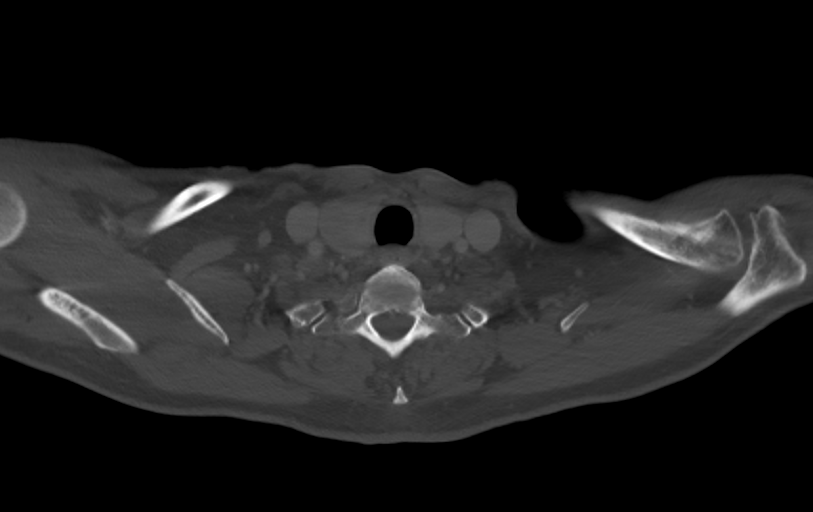
[im 95/159  bone]
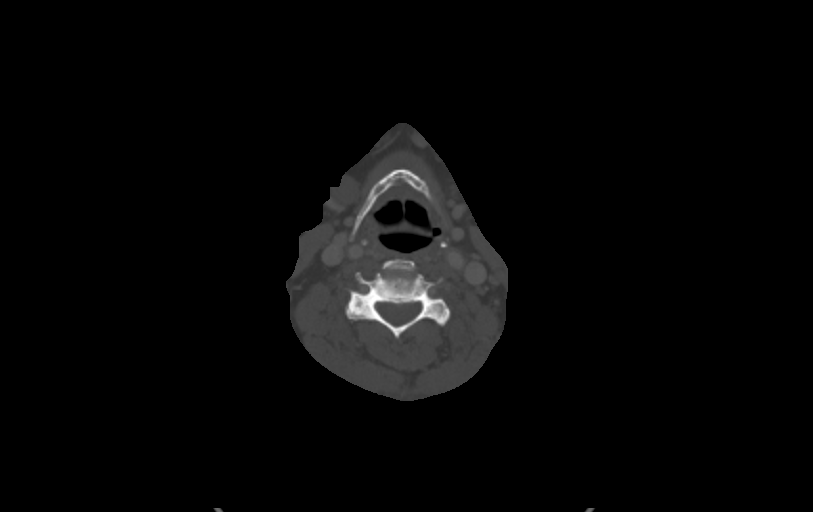
[im 127/159  bone]
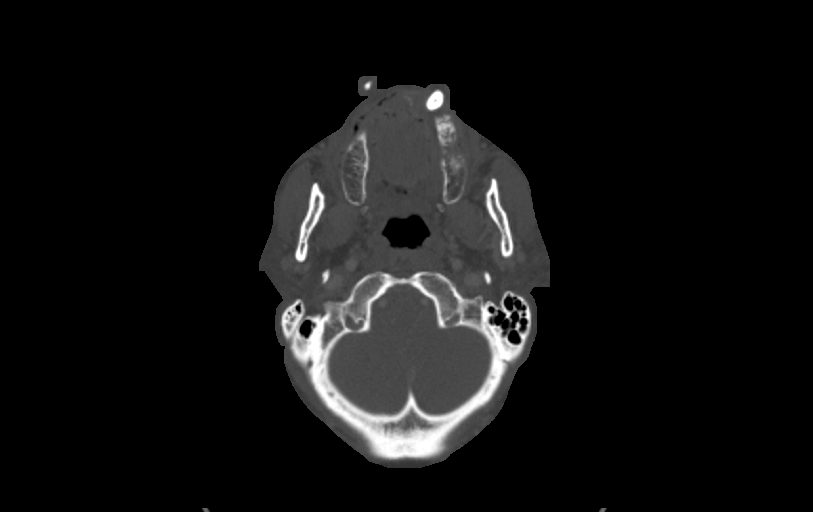

[8 of 14 positions shown; findings below may reference images not displayed]

FINDINGS: Pharynx and larynx: No focal mucosal or submucosal lesions are
present. The tongue base is within normal limits. Vocal cords are
midline and symmetric.

Salivary glands: A 2.2 x 2.2 x 1.8 cm homogeneous mass lesion is
evident along the anterior aspect of the right submandibular gland.
It is unclear whether this is in the gland or separate from the
gland. The submandibular glands are somewhat atrophic. The parotid
glands are within normal limits bilaterally.

Thyroid: The thyroid is somewhat heterogeneous. The right lobe is
larger than the left. A low-density nodule at the upper pole of the
right lobe measures 6 mm. No other dominant lesion is present.

Lymph nodes: Benign appearing sub cm level 1 and level 2 lymph nodes
are present bilaterally. No pathologic nodes are evident.

Vascular: Mild wall thickening is evident in the distal common
carotid artery and through the bifurcation bilaterally. There is no
significant stenosis. Vascular calcifications are present at the
aortic arch without focal stenosis.

Limited intracranial: Unremarkable

Visualized orbits: Within normal limits

Mastoids and visualized paranasal sinuses: Clear

Skeleton: Multilevel endplate degenerative changes are most
prominent C4-7. No focal lytic or blastic lesions are present.
Anterior osteophytes are fused. The facet joints are fused on the
right at C3-4.

Upper chest: The lung apices are clear without focal nodule, mass,
or airspace disease.
IMPRESSION: 1. Homogeneous mass lesion adjacent or within the anterior aspect of
the right submandibular gland measures 2.2 x 2.2 x 1.8 cm. The
lesion appears to be within the gland, most compatible with a benign
mixed tumor. Warthin's tumor in the submandibular gland is less
common. Alternatively, this could represent a metastatic lymph node
of unknown primary. No other pathologic lymph nodes are PET scan of
the neck would be helpful for further evaluation.
2. Diffuse atherosclerotic changes without definite stenosis.
3. Multilevel spondylosis of the cervical spine.

## 2017-08-18 ENCOUNTER — Other Ambulatory Visit: Payer: Self-pay

## 2017-08-18 DIAGNOSIS — C61 Malignant neoplasm of prostate: Secondary | ICD-10-CM

## 2017-08-19 ENCOUNTER — Ambulatory Visit: Payer: Medicare PPO | Admitting: Urology

## 2017-10-07 ENCOUNTER — Encounter: Payer: Self-pay | Admitting: Urology

## 2017-10-07 ENCOUNTER — Other Ambulatory Visit
Admission: RE | Admit: 2017-10-07 | Discharge: 2017-10-07 | Disposition: A | Discharge status: Home / Self Care | Payer: Medicare PPO | Source: Ambulatory Visit | Attending: Urology | Admitting: Urology

## 2017-10-07 ENCOUNTER — Ambulatory Visit (INDEPENDENT_AMBULATORY_CARE_PROVIDER_SITE_OTHER): Payer: Medicare PPO | Admitting: Urology

## 2017-10-07 VITALS — BP 139/82 | HR 86 | Ht 70.0 in | Wt 221.0 lb

## 2017-10-07 DIAGNOSIS — R351 Nocturia: Secondary | ICD-10-CM | POA: Diagnosis not present

## 2017-10-07 DIAGNOSIS — N529 Male erectile dysfunction, unspecified: Secondary | ICD-10-CM

## 2017-10-07 DIAGNOSIS — C61 Malignant neoplasm of prostate: Secondary | ICD-10-CM

## 2017-10-07 NOTE — Progress Notes (Signed)
10/07/2017 3:07 PM   Bradley Chaney 12-Nov-1940 [id number]  Referring provider: Birdie Sons, MD [address] [place], [place] [address]  Chief Complaint  Patient presents with  . Prostate Cancer    18month    HPI: 77 year-old male with T1c Gleason 4+4, 3+5 prostate cancer, PSA of 100 dx by Dr. Elnoria Howard on 10/2014 s/p EBRT with Dr. Massie Maroon.  Most recent PSA 0.2 on 01/2017 which continues to trend downward.  His PSA is never been undetectable.   Metastatic work up at time of dx including bone scan and CT abd/ pelvis without obvious metastatic diease but osteophyte formation int he 9-10th ribs and prominent pelvic LN but not diagnostic.    He was started on Firmagon by Dr. Elnoria Howard and transitioned to Lupron.    His last 67-month dose was given on 01/2017.  He has rare but persistent hot flashes.  Libido remains poor.    He also has a history of ED function and has been using Viagra as needed. Due to his decreased libido, he's not been using this regularly.  This is unchanged.    No significant voiding symptoms today.  His nocturia x 2-3.     PMH: Past Medical History:  Diagnosis Date  . Acid reflux   . Anemia   . Arteriosclerosis of coronary artery 06/25/2014   Overview:  S/p subendocardial MI, PCI and stent placement with cypher stent to right coronary artery and obtuse marginal 2 03/2008.   Cath 03/2008 showed 20 & 25% lad, 30% obtuse marginal 1, 30% left circumflex and stent in obtuse marginal 2 and right coronary artery.   acute STEMI with infererior wall hypokenesis and PCI stent placement of right coronary artery. 2014.    . Arthritis   . Benign essential HTN 06/25/2014  . CAD (coronary artery disease)   . Cardiomyopathy, ischemic 07/23/2014   Overview:  inferior hypokinesis ef 40%   . Dermatitis, eczematoid 10/17/2014  . Dizziness   . Eczema   . Hyperglycemia   . Hyperlipidemia   . Hypertension   . Myocardial infarction ([place])   . Osteoarthritis 10/18/2014  .  Prostate cancer ([place]) 10/2014    Surgical History: Past Surgical History:  Procedure Laterality Date  . CARDIAC SURGERY     MIX2, stents placed  . CORONARY STENT PLACEMENT     RCA    Home Medications:  Allergies as of 10/07/2017   No Known Allergies     Medication List        Accurate as of 10/07/17  3:07 PM. Always use your most recent med list.          amLODipine 10 MG tablet Commonly known as:  NORVASC TAKE 1 TABLET BY MOUTH DAILY   aspirin EC 81 MG tablet Take 81 mg by mouth daily.   atorvastatin 40 MG tablet Commonly known as:  LIPITOR TAKE 1 TABLET BY MOUTH DAILY   hydrochlorothiazide 12.5 MG capsule Commonly known as:  MICROZIDE TAKE ONE CAPSULE BY MOUTH EVERY DAY   lisinopril 20 MG tablet Commonly known as:  PRINIVIL,ZESTRIL Take 20 mg by mouth daily.   metoprolol succinate 25 MG 24 hr tablet Commonly known as:  TOPROL-XL TAKE 1 TABLET BY MOUTH DAILY   sildenafil 20 MG tablet Commonly known as:  REVATIO Take 1 tablet (20 mg total) by mouth as needed. Take 1-5 tabs as needed prior to intercourse   traMADol 50 MG tablet Commonly known as:  ULTRAM TAKE 1  TABLET BY MOUTH EVERY 6 HOURS AS NEEDED       Allergies: No Known Allergies  Family History: Family History  Problem Relation Age of Onset  . Cancer Mother   . Kidney disease Neg Hx   . Prostate cancer Neg Hx     Social History:  reports that he quit smoking about 55 years ago. His smoking use included cigarettes. He quit after 3.00 years of use. He has never used smokeless tobacco. He reports that he does not drink alcohol or use drugs.  ROS: UROLOGY Frequent Urination?: No Hard to postpone urination?: No Burning/pain with urination?: No Get up at night to urinate?: Yes Leakage of urine?: No Urine stream starts and stops?: No Trouble starting stream?: No Do you have to strain to urinate?: No Blood in urine?: No Urinary tract infection?: No Sexually transmitted disease?:  No Injury to kidneys or bladder?: No Painful intercourse?: No Weak stream?: No Erection problems?: No Penile pain?: No  Gastrointestinal Nausea?: No Vomiting?: No Indigestion/heartburn?: No Diarrhea?: No Constipation?: No  Constitutional Fever: No Night sweats?: Yes Weight loss?: No Fatigue?: No  Skin Skin rash/lesions?: Yes Itching?: No  Eyes Blurred vision?: Yes Double vision?: No  Ears/Nose/Throat Sore throat?: No Sinus problems?: No  Hematologic/Lymphatic Swollen glands?: No Easy bruising?: No  Cardiovascular Leg swelling?: No Chest pain?: No  Respiratory Cough?: No Shortness of breath?: No  Endocrine Excessive thirst?: No  Musculoskeletal Back pain?: No Joint pain?: No  Neurological Headaches?: No Dizziness?: No  Psychologic Depression?: No Anxiety?: No  Physical Exam: BP 139/82 (BP Location: Left Arm, Patient Position: Sitting, Cuff Size: Normal)   Pulse 86   Ht 5\' 10"  (1.778 m)   Wt 221 lb (100.2 kg)   BMI 31.71 kg/m   Constitutional:  Alert and oriented, No acute distress. HEENT: [place] AT, moist mucus membranes.  Trachea midline, no masses. Cardiovascular: No clubbing, cyanosis, or edema. Respiratory: Normal respiratory effort, no increased work of breathing. Skin: No rashes, bruises or suspicious lesions. Neurologic: Grossly intact, no focal deficits, moving all 4 extremities. Psychiatric: Normal mood and affect.  Laboratory Data: Lab Results  Component Value Date   WBC 4.5 06/19/2015   HGB 10.9 (L) 06/19/2015   HCT 30.9 (L) 06/19/2015   MCV 82 06/19/2015   PLT 354 06/19/2015    Lab Results  Component Value Date   CREATININE 0.94 10/27/2016    Lab Results  Component Value Date   TESTOSTERONE 10 (L) 01/30/2016    Lab Results  Component Value Date   HGBA1C 5.1 10/27/2016   Component     Latest Ref Rng & Units 10/18/2014 01/30/2016 07/13/2016 02/01/2017  Prostate Specific Ag, Serum     0.0 - 4.0 ng/mL 99.6 (H) 0.9 0.6  0.2    Urinalysis n/a  Pertinent Imaging: n/a  Assessment & Plan:    1. Prostate cancer ([place]) High risk prostate cancers/p IMRT S/p lupron x 3 year now complete, last 6 mo depo given 01/2017 We will hold off on further ADT at this time but follow PSA with low threshold to resume PSA drawn today Recommend return in 6 months with a PSA prior  2. Nocturia Symptoms stable No longer taking flomax  3. Erectile dysfunction, unspecified erectile dysfunction type Continue sildenafil as needed We will check a testosterone today to see if this is recovering   Return in about 6 months (around 04/09/2018) for PSA.  Hollice Espy, MD  [facility] [address] [place],  [place] [address] [phone]

## 2017-10-08 LAB — TESTOSTERONE: Testosterone: 38 ng/dL — ABNORMAL LOW (ref 264–916)

## 2017-10-10 ENCOUNTER — Telehealth: Payer: Self-pay

## 2017-10-10 NOTE — Telephone Encounter (Signed)
-----   Message from Hollice Espy, MD sent at 10/09/2017  5:36 PM EDT ----- Testosterone very low but slowly rising.  Unfortunately, PSA was not drawn and needs to be collected.  Sorry!  Please order and sure that he gets this done in the near future.    Hollice Espy, MD

## 2017-10-10 NOTE — Telephone Encounter (Signed)
Pt vm full

## 2017-10-11 NOTE — Telephone Encounter (Signed)
Pt vm full

## 2017-10-12 ENCOUNTER — Telehealth: Payer: Self-pay | Admitting: Urology

## 2017-10-12 NOTE — Telephone Encounter (Signed)
Pt returning nurse call. Pt wants to know results, I did politely mention pt VM is full and assistant is unable to leave message.  please advise. Thanks.

## 2017-10-31 ENCOUNTER — Ambulatory Visit (INDEPENDENT_AMBULATORY_CARE_PROVIDER_SITE_OTHER): Payer: Medicare PPO

## 2017-10-31 VITALS — BP 148/72 | HR 61 | Temp 98.1°F | Ht 70.0 in | Wt 225.6 lb

## 2017-10-31 DIAGNOSIS — Z0000 Encounter for general adult medical examination without abnormal findings: Secondary | ICD-10-CM | POA: Diagnosis not present

## 2017-10-31 NOTE — Progress Notes (Signed)
Patient: Bradley Chaney Male    DOB: Jul 16, 1940   77 y.o.   MRN: [id number] Visit Date: 11/01/2017  Today's Provider: Lelon Huh, MD   Chief Complaint  Patient presents with  . Rash   Subjective:    Rash  This is a chronic problem. The current episode started more than 1 year ago. The affected locations include the right lower leg and left lower leg. The rash is characterized by scaling, redness, itchiness and dryness. He was exposed to nothing. Pertinent negatives include no anorexia, congestion, cough, diarrhea, eye pain, facial edema, fatigue, fever, joint pain, nail changes, rhinorrhea, shortness of breath, sore throat or vomiting. Treatments tried: vaseline. The treatment provided mild relief.    Patient has had a rash on bilateral lower legs for a long time. Patient states rash is intermittent. Rash is itchy and red. Patient states rash will dry out and become flaky. Patient states he has been using Vaseline.   BP Readings from Last 3 Encounters:  11/01/17 (!) 91/54  10/31/17 (!) 148/72  10/07/17 139/82     No Known Allergies   Current Outpatient Medications:  .  amLODipine (NORVASC) 10 MG tablet, TAKE 1 TABLET BY MOUTH DAILY, Disp: 90 tablet, Rfl: 1 .  aspirin EC 81 MG tablet, Take 81 mg by mouth daily., Disp: , Rfl:  .  atorvastatin (LIPITOR) 40 MG tablet, TAKE 1 TABLET BY MOUTH DAILY, Disp: 90 tablet, Rfl: 1 .  hydrochlorothiazide (MICROZIDE) 12.5 MG capsule, TAKE ONE CAPSULE BY MOUTH EVERY DAY, Disp: 90 capsule, Rfl: 2 .  isosorbide mononitrate (IMDUR) 30 MG 24 hr tablet, Take 30 mg by mouth daily. , Disp: , Rfl:  .  lisinopril (PRINIVIL,ZESTRIL) 20 MG tablet, Take 20 mg by mouth daily. , Disp: , Rfl:  .  metoprolol succinate (TOPROL-XL) 25 MG 24 hr tablet, TAKE 1 TABLET BY MOUTH DAILY, Disp: 90 tablet, Rfl: 1 .  sildenafil (REVATIO) 20 MG tablet, Take 1 tablet (20 mg total) by mouth as needed. Take 1-5 tabs as needed prior to intercourse, Disp: 30 tablet,  Rfl: 11 .  traMADol (ULTRAM) 50 MG tablet, TAKE 1 TABLET BY MOUTH EVERY 6 HOURS AS NEEDED, Disp: 120 tablet, Rfl: 5  Review of Systems  Constitutional: Negative for appetite change, chills, fatigue and fever.  HENT: Negative for congestion, rhinorrhea and sore throat.   Eyes: Negative for pain.  Respiratory: Negative for cough, chest tightness, shortness of breath and wheezing.   Cardiovascular: Negative for chest pain and palpitations.  Gastrointestinal: Negative for abdominal pain, anorexia, diarrhea, nausea and vomiting.  Musculoskeletal: Negative for joint pain.  Skin: Positive for rash. Negative for nail changes.    Social History   Tobacco Use  . Smoking status: Former Smoker    Years: 3.00    Types: Cigarettes    Last attempt to quit: 09/10/1962    Years since quitting: 55.1  . Smokeless tobacco: Never Used  Substance Use Topics  . Alcohol use: No    Alcohol/week: 0.0 standard drinks   Objective:   BP (!) 91/54 (BP Location: Right Arm, Patient Position: Sitting, Cuff Size: Large)   Pulse 67   Temp 97.6 F (36.4 C) (Oral)   Resp 16   Wt 224 lb (101.6 kg)   SpO2 97%   BMI 32.14 kg/m  Vitals:   11/01/17 1629 11/01/17 1653  BP: (!) 91/54 110/60  Pulse: 67   Resp: 16   Temp: 97.6 F (36.4 C)  TempSrc: Oral   SpO2: 97%   Weight: 224 lb (101.6 kg)      Physical Exam  General appearance: alert, well developed, well nourished, cooperative and in no distress Head: Normocephalic, without obvious abnormality, atraumatic Respiratory: Respirations even and unlabored, normal respiratory rate Extremities: No gross deformities Skin: Scaly grayish patches of discoloration interior lower legs c/w eczema     Assessment & Plan:     1. Other eczema Call for dermatology referral if not improving in 1-2 weeks.  - triamcinolone cream (KENALOG) 0.1 %; Apply 1 application topically 2 (two) times daily.  Dispense: 45 g; Refill: 1       Lelon Huh, MD  [facility]

## 2017-10-31 NOTE — Patient Instructions (Signed)
[name] , Thank you for taking time to come for your Medicare Wellness Visit. I appreciate your ongoing commitment to your health goals. Please review the following plan we discussed and let me know if I can assist you in the future.   Screening recommendations/referrals: Colonoscopy: N/A Recommended yearly ophthalmology/optometry visit for glaucoma screening and checkup Recommended yearly dental visit for hygiene and checkup  Vaccinations: Influenza vaccine: N/A Pneumococcal vaccine: Up to date Tdap vaccine: Pt declines today.  Shingles vaccine: Pt declines today.     Advanced directives: Please bring a copy of your POA (Power of Attorney) and/or Living Will to your next appointment once completed.   Conditions/risks identified: Recommend increasing water intake to 4 glasses a day.   Next appointment: 11/01/17 @ 4:20 PM with Dr Caryn Section.   Preventive Care 34 Years and Older, Male Preventive care refers to lifestyle choices and visits with your health care provider that can promote health and wellness. What does preventive care include?  A yearly physical exam. This is also called an annual well check.  Dental exams once or twice a year.  Routine eye exams. Ask your health care provider how often you should have your eyes checked.  Personal lifestyle choices, including:  Daily care of your teeth and gums.  Regular physical activity.  Eating a healthy diet.  Avoiding tobacco and drug use.  Limiting alcohol use.  Practicing safe sex.  Taking low doses of aspirin every day.  Taking vitamin and mineral supplements as recommended by your health care provider. What happens during an annual well check? The services and screenings done by your health care provider during your annual well check will depend on your age, overall health, lifestyle risk factors, and family history of disease. Counseling  Your health care provider may ask you questions about your:  Alcohol  use.  Tobacco use.  Drug use.  Emotional well-being.  Home and relationship well-being.  Sexual activity.  Eating habits.  History of falls.  Memory and ability to understand (cognition).  Work and work Statistician. Screening  You may have the following tests or measurements:  Height, weight, and BMI.  Blood pressure.  Lipid and cholesterol levels. These may be checked every 5 years, or more frequently if you are over 79 years old.  Skin check.  Lung cancer screening. You may have this screening every year starting at age 19 if you have a 30-pack-year history of smoking and currently smoke or have quit within the past 15 years.  Fecal occult blood test (FOBT) of the stool. You may have this test every year starting at age 79.  Flexible sigmoidoscopy or colonoscopy. You may have a sigmoidoscopy every 5 years or a colonoscopy every 10 years starting at age 61.  Prostate cancer screening. Recommendations will vary depending on your family history and other risks.  Hepatitis C blood test.  Hepatitis B blood test.  Sexually transmitted disease (STD) testing.  Diabetes screening. This is done by checking your blood sugar (glucose) after you have not eaten for a while (fasting). You may have this done every 1-3 years.  Abdominal aortic aneurysm (AAA) screening. You may need this if you are a current or former smoker.  Osteoporosis. You may be screened starting at age 86 if you are at high risk. Talk with your health care provider about your test results, treatment options, and if necessary, the need for more tests. Vaccines  Your health care provider may recommend certain vaccines, such as:  Influenza vaccine. This is recommended every year.  Tetanus, diphtheria, and acellular pertussis (Tdap, Td) vaccine. You may need a Td booster every 10 years.  Zoster vaccine. You may need this after age 19.  Pneumococcal 13-valent conjugate (PCV13) vaccine. One dose is  recommended after age 32.  Pneumococcal polysaccharide (PPSV23) vaccine. One dose is recommended after age 51. Talk to your health care provider about which screenings and vaccines you need and how often you need them. This information is not intended to replace advice given to you by your health care provider. Make sure you discuss any questions you have with your health care provider. Document Released: 03/28/2015 Document Revised: 11/19/2015 Document Reviewed: 12/31/2014 Elsevier Interactive Patient Education  2017 [place] Prevention in the Home Falls can cause injuries. They can happen to people of all ages. There are many things you can do to make your home safe and to help prevent falls. What can I do on the outside of my home?  Regularly fix the edges of walkways and driveways and fix any cracks.  Remove anything that might make you trip as you walk through a door, such as a raised step or threshold.  Trim any bushes or trees on the path to your home.  Use bright outdoor lighting.  Clear any walking paths of anything that might make someone trip, such as rocks or tools.  Regularly check to see if handrails are loose or broken. Make sure that both sides of any steps have handrails.  Any raised decks and porches should have guardrails on the edges.  Have any leaves, snow, or ice cleared regularly.  Use sand or salt on walking paths during winter.  Clean up any spills in your garage right away. This includes oil or grease spills. What can I do in the bathroom?  Use night lights.  Install grab bars by the toilet and in the tub and shower. Do not use towel bars as grab bars.  Use non-skid mats or decals in the tub or shower.  If you need to sit down in the shower, use a plastic, non-slip stool.  Keep the floor dry. Clean up any water that spills on the floor as soon as it happens.  Remove soap buildup in the tub or shower regularly.  Attach bath mats  securely with double-sided non-slip rug tape.  Do not have throw rugs and other things on the floor that can make you trip. What can I do in the bedroom?  Use night lights.  Make sure that you have a light by your bed that is easy to reach.  Do not use any sheets or blankets that are too big for your bed. They should not hang down onto the floor.  Have a firm chair that has side arms. You can use this for support while you get dressed.  Do not have throw rugs and other things on the floor that can make you trip. What can I do in the kitchen?  Clean up any spills right away.  Avoid walking on wet floors.  Keep items that you use a lot in easy-to-reach places.  If you need to reach something above you, use a strong step stool that has a grab bar.  Keep electrical cords out of the way.  Do not use floor polish or wax that makes floors slippery. If you must use wax, use non-skid floor wax.  Do not have throw rugs and other things on the floor that  can make you trip. What can I do with my stairs?  Do not leave any items on the stairs.  Make sure that there are handrails on both sides of the stairs and use them. Fix handrails that are broken or loose. Make sure that handrails are as long as the stairways.  Check any carpeting to make sure that it is firmly attached to the stairs. Fix any carpet that is loose or worn.  Avoid having throw rugs at the top or bottom of the stairs. If you do have throw rugs, attach them to the floor with carpet tape.  Make sure that you have a light switch at the top of the stairs and the bottom of the stairs. If you do not have them, ask someone to add them for you. What else can I do to help prevent falls?  Wear shoes that:  Do not have high heels.  Have rubber bottoms.  Are comfortable and fit you well.  Are closed at the toe. Do not wear sandals.  If you use a stepladder:  Make sure that it is fully opened. Do not climb a closed  stepladder.  Make sure that both sides of the stepladder are locked into place.  Ask someone to hold it for you, if possible.  Clearly mark and make sure that you can see:  Any grab bars or handrails.  First and last steps.  Where the edge of each step is.  Use tools that help you move around (mobility aids) if they are needed. These include:  Canes.  Walkers.  Scooters.  Crutches.  Turn on the lights when you go into a dark area. Replace any light bulbs as soon as they burn out.  Set up your furniture so you have a clear path. Avoid moving your furniture around.  If any of your floors are uneven, fix them.  If there are any pets around you, be aware of where they are.  Review your medicines with your doctor. Some medicines can make you feel dizzy. This can increase your chance of falling. Ask your doctor what other things that you can do to help prevent falls. This information is not intended to replace advice given to you by your health care provider. Make sure you discuss any questions you have with your health care provider. Document Released: 12/26/2008 Document Revised: 08/07/2015 Document Reviewed: 04/05/2014 Elsevier Interactive Patient Education  2017 [employer].

## 2017-10-31 NOTE — Progress Notes (Signed)
Subjective:   Bradley Chaney is a 77 y.o. male who presents for Medicare Annual/Subsequent preventive examination.  Review of Systems:  N/A  Cardiac Risk Factors include: advanced age (>35men, >42 women);dyslipidemia;hypertension;obesity (BMI >30kg/m2);male gender     Objective:    Vitals: BP (!) 148/72 (BP Location: Right Arm)   Pulse 61   Temp 98.1 F (36.7 C) (Oral)   Ht 5\' 10"  (1.778 m)   Wt 225 lb 9.6 oz (102.3 kg)   BMI 32.37 kg/m   Body mass index is 32.37 kg/m.  Advanced Directives 10/31/2017 10/27/2016 10/20/2016 04/21/2016 07/04/2015 06/19/2015 01/13/2015  Does Patient Have a Medical Advance Directive? No No No No No No No  Would patient like information on creating a medical advance directive? - No - Patient declined Yes (ED - Information included in AVS) No - Patient declined No - patient declined information - Yes - Scientist, clinical (histocompatibility and immunogenetics) given    Tobacco Social History   Tobacco Use  Smoking Status Former Smoker  . Years: 3.00  . Types: Cigarettes  . Last attempt to quit: 09/10/1962  . Years since quitting: 55.1  Smokeless Tobacco Never Used     Counseling given: Not Answered   Clinical Intake:  Pre-visit preparation completed: Yes  Pain : No/denies pain Pain Score: 0-No pain     Nutritional Status: BMI > 30  Obese Nutritional Risks: None Diabetes: No  How often do you need to have someone help you when you read instructions, pamphlets, or other written materials from your doctor or pharmacy?: 1 - Never  Interpreter Needed?: No  Information entered by :: [facility], LPN  Past Medical History:  Diagnosis Date  . Acid reflux   . Anemia   . Arteriosclerosis of coronary artery 06/25/2014   Overview:  S/p subendocardial MI, PCI and stent placement with cypher stent to right coronary artery and obtuse marginal 2 03/2008.   Cath 03/2008 showed 20 & 25% lad, 30% obtuse marginal 1, 30% left circumflex and stent in obtuse marginal 2 and right coronary artery.    acute STEMI with infererior wall hypokenesis and PCI stent placement of right coronary artery. 2014.    . Arthritis   . Benign essential HTN 06/25/2014  . CAD (coronary artery disease)   . Cardiomyopathy, ischemic 07/23/2014   Overview:  inferior hypokinesis ef 40%   . Dermatitis, eczematoid 10/17/2014  . Dizziness   . Eczema   . Hyperglycemia   . Hyperlipidemia   . Hypertension   . Myocardial infarction ([place])   . Osteoarthritis 10/18/2014  . Prostate cancer ([place]) 10/2014   Past Surgical History:  Procedure Laterality Date  . CARDIAC SURGERY     MIX2, stents placed  . CORONARY STENT PLACEMENT     RCA   Family History  Problem Relation Age of Onset  . Cancer Mother   . Kidney disease Neg Hx   . Prostate cancer Neg Hx    Social History   Socioeconomic History  . Marital status: Married    Spouse name: Not on file  . Number of children: 4  . Years of education: College  . Highest education level: Some college, no degree  Occupational History  . Occupation: Retired  Scientific laboratory technician  . Financial resource strain: Not hard at all  . Food insecurity:    Worry: Never true    Inability: Never true  . Transportation needs:    Medical: No    Non-medical: No  Tobacco Use  . Smoking  status: Former Smoker    Years: 3.00    Types: Cigarettes    Last attempt to quit: 09/10/1962    Years since quitting: 55.1  . Smokeless tobacco: Never Used  Substance and Sexual Activity  . Alcohol use: No    Alcohol/week: 0.0 standard drinks  . Drug use: No  . Sexual activity: Not on file  Lifestyle  . Physical activity:    Days per week: Not on file    Minutes per session: Not on file  . Stress: Not at all  Relationships  . Social connections:    Talks on phone: Not on file    Gets together: Not on file    Attends religious service: Not on file    Active member of club or organization: Not on file    Attends meetings of clubs or organizations: Not on file    Relationship status: Not on  file  Other Topics Concern  . Not on file  Social History Narrative  . Not on file    Outpatient Encounter Medications as of 10/31/2017  Medication Sig  . amLODipine (NORVASC) 10 MG tablet TAKE 1 TABLET BY MOUTH DAILY  . aspirin EC 81 MG tablet Take 81 mg by mouth daily.  Marland Kitchen atorvastatin (LIPITOR) 40 MG tablet TAKE 1 TABLET BY MOUTH DAILY  . hydrochlorothiazide (MICROZIDE) 12.5 MG capsule TAKE ONE CAPSULE BY MOUTH EVERY DAY  . isosorbide mononitrate (IMDUR) 30 MG 24 hr tablet Take 30 mg by mouth daily.   Marland Kitchen lisinopril (PRINIVIL,ZESTRIL) 20 MG tablet Take 20 mg by mouth daily.   . metoprolol succinate (TOPROL-XL) 25 MG 24 hr tablet TAKE 1 TABLET BY MOUTH DAILY  . sildenafil (REVATIO) 20 MG tablet Take 1 tablet (20 mg total) by mouth as needed. Take 1-5 tabs as needed prior to intercourse  . traMADol (ULTRAM) 50 MG tablet TAKE 1 TABLET BY MOUTH EVERY 6 HOURS AS NEEDED   No facility-administered encounter medications on file as of 10/31/2017.     Activities of Daily Living In your present state of health, do you have any difficulty performing the following activities: 10/31/2017  Hearing? N  Vision? 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  Difficulty concentrating or making decisions? N  Walking or climbing stairs? Y  Comment Due to knee pain (needs knee replacement scx).  Dressing or bathing? N  Doing errands, shopping? N  Preparing Food and eating ? N  Using the Toilet? N  In the past six months, have you accidently leaked urine? N  Do you have problems with loss of bowel control? N  Managing your Medications? N  Managing your Finances? N  Housekeeping or managing your Housekeeping? N  Some recent data might be hidden    Patient Care Team: Birdie Sons, MD as PCP - General (Family Medicine) Noreene Filbert, MD as Referring Physician (Radiation Oncology) Corey Skains, MD as Consulting Physician (Cardiology) Hollice Espy, MD as Consulting Physician (Urology)     Assessment:   This is a routine wellness examination for Bradley Chaney.  Exercise Activities and Dietary recommendations Current Exercise Habits: The patient does not participate in regular exercise at present, Exercise limited by: orthopedic condition(s)  Goals    . Exercise 150 minutes per week (moderate activity)    . Increase water intake     Recommend increasing water intake to 4-6 glasses a day.        Fall Risk Fall Risk  10/31/2017 10/27/2016 10/20/2016 04/21/2016 07/04/2015  Falls in the past year? No No No No No   Is the patient's home free of loose throw rugs in walkways, pet beds, electrical cords, etc?   yes      Grab bars in the bathroom? no      Handrails on the stairs?   no      Adequate lighting?   yes  Timed Get Up and Go Performed: N/A  Depression Screen PHQ 2/9 Scores 10/31/2017 10/27/2016 10/20/2016 04/21/2016  PHQ - 2 Score 0 0 0 0    Cognitive Function- Pt declined screening today.         Immunization History  Administered Date(s) Administered  . Pneumococcal Conjugate-13 06/18/2014  . Pneumococcal Polysaccharide-23 06/19/2015    Qualifies for Shingles Vaccine? Due for Shingles vaccine. Declined my offer to administer today. Education has been provided regarding the importance of this vaccine. Pt has been advised to call her insurance company to determine her out of pocket expense. Advised she may also receive this vaccine at her local pharmacy or Health Dept. Verbalized acceptance and understanding.  Screening Tests Health Maintenance  Topic Date Due  . INFLUENZA VACCINE  10/13/2017  . TETANUS/TDAP  03/15/2026 (Originally 02/24/1960)  . PNA vac Low Risk Adult  Completed   Cancer Screenings: Lung: Low Dose CT Chest recommended if Age 13-80 years, 30 pack-year currently smoking OR have quit w/in 15years. Patient does not qualify. Colorectal: N/A  Additional Screenings:  Hepatitis C Screening: N/A      Plan:  I have personally reviewed and addressed the  Medicare Annual Wellness questionnaire and have noted the following in the patient's chart:  A. Medical and social history B. Use of alcohol, tobacco or illicit drugs  C. Current medications and supplements D. Functional ability and status E.  Nutritional status F.  Physical activity G. Advance directives H. List of other physicians I.  Hospitalizations, surgeries, and ER visits in previous 12 months J.  [place] such as hearing and vision if needed, cognitive and depression L. Referrals and appointments - none  In addition, I have reviewed and discussed with patient certain preventive protocols, quality metrics, and best practice recommendations. A written personalized care plan for preventive services as well as general preventive health recommendations were provided to patient.  See attached scanned questionnaire for additional information.   Signed,  Fabio Neighbors, LPN Nurse Health Advisor   Nurse Recommendations: Pt declined the tetanus vaccine today.

## 2017-11-01 ENCOUNTER — Ambulatory Visit: Payer: Medicare PPO | Admitting: Family Medicine

## 2017-11-01 ENCOUNTER — Encounter: Payer: Self-pay | Admitting: Family Medicine

## 2017-11-01 VITALS — BP 110/60 | HR 67 | Temp 97.6°F | Resp 16 | Wt 224.0 lb

## 2017-11-01 DIAGNOSIS — L308 Other specified dermatitis: Secondary | ICD-10-CM | POA: Diagnosis not present

## 2017-11-01 MED ORDER — TRIAMCINOLONE ACETONIDE 0.1 % EX CREA: 1.0000 "application " | TOPICAL_CREAM | Freq: Two times a day (BID) | CUTANEOUS | 1 refills | Status: AC

## 2017-11-02 ENCOUNTER — Inpatient Hospital Stay: Payer: Medicare PPO | Attending: Radiation Oncology

## 2017-11-08 ENCOUNTER — Inpatient Hospital Stay: Payer: Medicare PPO | Attending: Radiation Oncology

## 2017-11-08 ENCOUNTER — Other Ambulatory Visit: Payer: Self-pay

## 2017-11-08 DIAGNOSIS — C61 Malignant neoplasm of prostate: Secondary | ICD-10-CM | POA: Diagnosis present

## 2017-11-08 LAB — PSA: Prostatic Specific Antigen: 0.1 ng/mL (ref 0.00–4.00)

## 2017-11-09 ENCOUNTER — Other Ambulatory Visit: Payer: Self-pay

## 2017-11-09 ENCOUNTER — Other Ambulatory Visit: Payer: Self-pay | Admitting: *Deleted

## 2017-11-09 ENCOUNTER — Encounter: Payer: Self-pay | Admitting: Radiation Oncology

## 2017-11-09 ENCOUNTER — Ambulatory Visit
Admission: RE | Admit: 2017-11-09 | Discharge: 2017-11-09 | Disposition: A | Discharge status: Home / Self Care | Payer: Medicare PPO | Source: Ambulatory Visit | Attending: Radiation Oncology | Admitting: Radiation Oncology

## 2017-11-09 VITALS — BP 182/93 | HR 73 | Temp 97.0°F | Wt 221.1 lb

## 2017-11-09 DIAGNOSIS — Z923 Personal history of irradiation: Secondary | ICD-10-CM | POA: Diagnosis not present

## 2017-11-09 DIAGNOSIS — N529 Male erectile dysfunction, unspecified: Secondary | ICD-10-CM | POA: Diagnosis not present

## 2017-11-09 DIAGNOSIS — C61 Malignant neoplasm of prostate: Secondary | ICD-10-CM | POA: Diagnosis present

## 2017-11-09 NOTE — Progress Notes (Signed)
Radiation Oncology Follow up Note  Name: Bradley Chaney and   Date:   11/09/2017 MRN:  [id number] DOB: [birth year]    This 77 y.o. 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  REFERRING PROVIDER: Birdie Sons, MD  HPI: patient is a 77 year old male now out 2 years having completed IM RT radiation therapy for Gleason 8 (4+4) adenocarcinoma prostate presenting with a PSA of 100. He is seen today in routine follow-up and is doing well. Most recent PSA was 0.10. He did have treatment to both his prostate and pelvic nodes. He has been having androgen deprivation therapy by Dr. Cherrie Gauze office. He specifically denies diarrhea dysuria or any other GI/GU complaints. He is having some issues with erectile dysfunction has tried Viagra without significant success.  COMPLICATIONS OF TREATMENT: none  FOLLOW UP COMPLIANCE: keeps appointments   PHYSICAL EXAM:  BP (!) 182/93 (BP Location: Left Arm, Patient Position: Sitting)   Pulse 73   Temp (!) 97 F (36.1 C) (Tympanic)   Wt 221 lb 1.9 oz (100.3 kg)   BMI 31.73 kg/m  Well-developed well-nourished patient in NAD. HEENT reveals PERLA, EOMI, discs not visualized.  Oral cavity is clear. No oral mucosal lesions are identified. Neck is clear without evidence of cervical or supraclavicular adenopathy. Lungs are clear to A&P. Cardiac examination is essentially unremarkable with regular rate and rhythm without murmur rub or thrill. Abdomen is benign with no organomegaly or masses noted. Motor sensory and DTR levels are equal and symmetric in the upper and lower extremities. Cranial nerves II through XII are grossly intact. Proprioception is intact. No peripheral adenopathy or edema is identified. No motor or sensory levels are noted. Crude visual fields are within normal range.  RADIOLOGY RESULTS: no current results for review  PLAN: present time he is under excellent  biochemical control of his prostate cancer. I'm please was overall progress. He continues close follow-up care with Dr. Erlene Quan at asked him to address his erectile dysfunction issues with her office. I've asked to see him back in 1 year for follow-up with a PSA prior to that visit. Patient knows to call with any concerns.  I would like to take this opportunity to thank you for allowing me to participate in the care of your patient.Noreene Filbert, MD

## 2017-12-02 ENCOUNTER — Encounter: Payer: Medicare PPO | Admitting: Family Medicine

## 2018-04-14 ENCOUNTER — Ambulatory Visit: Payer: Medicare HMO | Admitting: Urology

## 2018-06-14 DEATH — deceased

## 2018-11-02 ENCOUNTER — Ambulatory Visit: Payer: Medicare PPO

## 2018-11-14 ENCOUNTER — Other Ambulatory Visit: Payer: Medicare PPO

## 2018-11-15 ENCOUNTER — Ambulatory Visit: Payer: Medicare PPO | Admitting: Radiation Oncology
